# Patient Record
Sex: Male | Born: 1960 | Race: White | Hispanic: No | Marital: Single | Smoking: Current every day smoker
Health system: Southern US, Community
[De-identification: ages and names within clinical notes are randomized; demographics above are authoritative.]

---

## 2008-06-20 ENCOUNTER — Emergency Department (HOSPITAL_COMMUNITY): Admission: EM | Admit: 2008-06-20 | Discharge: 2008-06-20 | Payer: Self-pay | Admitting: Emergency Medicine

## 2009-08-24 ENCOUNTER — Emergency Department (HOSPITAL_COMMUNITY): Admission: EM | Admit: 2009-08-24 | Discharge: 2009-08-24 | Payer: Self-pay | Admitting: Emergency Medicine

## 2011-03-29 ENCOUNTER — Emergency Department (HOSPITAL_COMMUNITY)
Admission: EM | Admit: 2011-03-29 | Discharge: 2011-03-29 | Disposition: A | Discharge status: Home Health | Payer: Self-pay | Attending: Emergency Medicine | Admitting: Emergency Medicine

## 2011-03-29 ENCOUNTER — Encounter (HOSPITAL_COMMUNITY): Payer: Self-pay | Admitting: Emergency Medicine

## 2011-03-29 DIAGNOSIS — K029 Dental caries, unspecified: Secondary | ICD-10-CM | POA: Insufficient documentation

## 2011-03-29 DIAGNOSIS — K0889 Other specified disorders of teeth and supporting structures: Secondary | ICD-10-CM

## 2011-03-29 MED ORDER — PENICILLIN V POTASSIUM 500 MG PO TABS: ORAL_TABLET | ORAL | Status: DC

## 2011-03-29 MED ORDER — HYDROCODONE-ACETAMINOPHEN 5-325 MG PO TABS: 1.0000 | ORAL_TABLET | ORAL | Status: AC | PRN

## 2011-03-29 MED ORDER — HYDROCODONE-ACETAMINOPHEN 5-325 MG PO TABS
2.0000 | ORAL_TABLET | Freq: Once | ORAL | Status: AC
  Administered 2011-03-29: 2 via ORAL
  Filled 2011-03-29: qty 2

## 2011-03-29 MED ORDER — IBUPROFEN 800 MG PO TABS
800.0000 mg | ORAL_TABLET | Freq: Once | ORAL | Status: AC
  Administered 2011-03-29: 800 mg via ORAL
  Filled 2011-03-29: qty 1

## 2011-03-29 MED ORDER — PENICILLIN V POTASSIUM 250 MG PO TABS
500.0000 mg | ORAL_TABLET | Freq: Once | ORAL | Status: AC
  Administered 2011-03-29: 500 mg via ORAL
  Filled 2011-03-29: qty 2

## 2011-03-29 NOTE — Discharge Instructions (Signed)
Toothache Toothaches are usually caused by tooth decay (cavity). However, other causes of toothache include:  Gum disease.   Cracked tooth.   Cracked filling.   Injury.   Jaw problem (temporo mandibular joint or TMJ disorder).   Tooth abscess.   Root sensitivity.   Grinding.   Eruption problems.  Swelling and redness around a painful tooth often means you have a dental abscess. Pain medicine and antibiotics can help reduce symptoms, but you will need to see a dentist within the next few days to have your problem properly evaluated and treated. If tooth decay is the problem, you may need a filling or root canal to save your tooth. If the problem is more severe, your tooth may need to be pulled. SEEK IMMEDIATE MEDICAL CARE IF:  You cannot swallow.   You develop severe swelling, increased redness, or increased pain in your mouth or face.   You have a fever.   You cannot open your mouth adequately.  Document Released: 02/13/2004 Document Revised: 12/25/2010 Document Reviewed: 04/04/2009 [employer] Patient Information 2012 [employer]. 

## 2011-03-29 NOTE — ED Notes (Signed)
Pt with dental pain since Friday. Scheduled to have teeth pulled next week

## 2011-03-29 NOTE — ED Provider Notes (Signed)
History     CSN: [id number]  Arrival date & time 03/29/11  1350   First MD Initiated Contact with Patient 03/29/11 1534      Chief Complaint  Patient presents with  . Dental Pain    (Consider location/radiation/quality/duration/timing/severity/associated sxs/prior treatment) Patient is a 51 y.o. male presenting with tooth pain. The history is provided by the patient.  Dental PainThe primary symptoms include mouth pain and headaches. Primary symptoms do not include shortness of breath or cough. The symptoms began 2 days ago. The symptoms are worsening. The symptoms occur constantly.  The headache is not associated with photophobia.  Additional symptoms include: dental sensitivity to temperature, gum tenderness and jaw pain. Additional symptoms do not include: nosebleeds. Medical issues do not include: alcohol problem and smoking.    History reviewed. No pertinent past medical history.  History reviewed. No pertinent past surgical history.  No family history on file.  History  Substance Use Topics  . Smoking status: Never Smoker   . Smokeless tobacco: Not on file  . Alcohol Use: No      Review of Systems  Constitutional: Negative for activity change.       All ROS Neg except as noted in HPI  HENT: Positive for dental problem. Negative for nosebleeds and neck pain.   Eyes: Negative for photophobia and discharge.  Respiratory: Negative for cough, shortness of breath and wheezing.   Cardiovascular: Negative for chest pain and palpitations.  Gastrointestinal: Negative for abdominal pain and blood in stool.  Genitourinary: Negative for dysuria, frequency and hematuria.  Musculoskeletal: Negative for back pain and arthralgias.  Skin: Negative.   Neurological: Positive for headaches. Negative for dizziness, seizures and speech difficulty.  Psychiatric/Behavioral: Negative for hallucinations and confusion.    Allergies  Review of patient's allergies indicates no known  allergies.  Home Medications   Current Outpatient Rx  Name Route Sig Dispense Refill  . IBUPROFEN 200 MG PO TABS Oral Take 600 mg by mouth every 6 (six) hours as needed. For pain      BP 137/81  Pulse 71  Temp(Src) 97.6 F (36.4 C) (Oral)  Resp 18  Ht 5\' 9"  (1.753 m)  Wt 190 lb (86.183 kg)  BMI 28.06 kg/m2  SpO2 100%  Physical Exam  Nursing note and vitals reviewed. Constitutional: He is oriented to person, place, and time. He appears well-developed and well-nourished.  Non-toxic appearance.  HENT:  Head: Normocephalic.  Right Ear: Tympanic membrane and external ear normal.  Left Ear: Tympanic membrane and external ear normal.       Multiple dental caries, left greater than right. Airway patent.  Eyes: EOM and lids are normal. Pupils are equal, round, and reactive to light.  Neck: Normal range of motion. Neck supple. Carotid bruit is not present.  Cardiovascular: Normal rate, regular rhythm, normal heart sounds, intact distal pulses and normal pulses.   Pulmonary/Chest: Breath sounds normal. No respiratory distress.  Abdominal: Soft. Bowel sounds are normal. There is no tenderness. There is no guarding.  Musculoskeletal: Normal range of motion.  Lymphadenopathy:       Head (right side): No submandibular adenopathy present.       Head (left side): No submandibular adenopathy present.    He has no cervical adenopathy.  Neurological: He is alert and oriented to person, place, and time. He has normal strength. No cranial nerve deficit or sensory deficit.  Skin: Skin is warm and dry.  Psychiatric: He has a normal mood and affect. His  speech is normal.    ED Course  Procedures (including critical care time)  Labs Reviewed - No data to display No results found.   No diagnosis found.    MDM  I have reviewed nursing notes, vital signs, and all appropriate lab and imaging results for this patient. Pt to see a dentist in 2 weeks. Rx for amoxicillin and norco  given.       Kathie Dike, [place] 03/29/11 1540

## 2011-03-29 NOTE — ED Notes (Signed)
Pt DC to home with steady gait 

## 2011-03-30 NOTE — ED Provider Notes (Signed)
Medical screening examination/treatment/procedure(s) were performed by non-physician practitioner and as supervising physician I was immediately available for consultation/collaboration.   Dione Booze, MD 03/30/11 [phone]

## 2011-04-19 ENCOUNTER — Emergency Department (HOSPITAL_COMMUNITY)
Admission: EM | Admit: 2011-04-19 | Discharge: 2011-04-19 | Disposition: A | Discharge status: Home / Self Care | Payer: Self-pay | Attending: Emergency Medicine | Admitting: Emergency Medicine

## 2011-04-19 ENCOUNTER — Encounter (HOSPITAL_COMMUNITY): Payer: Self-pay | Admitting: *Deleted

## 2011-04-19 ENCOUNTER — Emergency Department (HOSPITAL_COMMUNITY): Payer: Self-pay

## 2011-04-19 DIAGNOSIS — W230XXA Caught, crushed, jammed, or pinched between moving objects, initial encounter: Secondary | ICD-10-CM | POA: Insufficient documentation

## 2011-04-19 DIAGNOSIS — F17200 Nicotine dependence, unspecified, uncomplicated: Secondary | ICD-10-CM | POA: Insufficient documentation

## 2011-04-19 DIAGNOSIS — S6000XA Contusion of unspecified finger without damage to nail, initial encounter: Secondary | ICD-10-CM | POA: Insufficient documentation

## 2011-04-19 MED ORDER — HYDROCODONE-ACETAMINOPHEN 5-325 MG PO TABS: 1.0000 | ORAL_TABLET | ORAL | Status: AC | PRN

## 2011-04-19 MED ORDER — HYDROCODONE-ACETAMINOPHEN 5-325 MG PO TABS
1.0000 | ORAL_TABLET | Freq: Once | ORAL | Status: AC
  Administered 2011-04-19: 1 via ORAL
  Filled 2011-04-19: qty 1

## 2011-04-19 NOTE — ED Notes (Signed)
Injury to left hand yesterday.  Reports hand was stuck between tank and bed of truck.  C/o pain.

## 2011-04-19 NOTE — ED Provider Notes (Signed)
History     CSN: [id number]  Arrival date & time 04/19/11  1115   First MD Initiated Contact with Patient 04/19/11 1136      Chief Complaint  Patient presents with  . Hand Pain    (Consider location/radiation/quality/duration/timing/severity/associated sxs/prior treatment) HPI Comments: Patient sustained an injury to his left thumb yesterday at home as it was briefly caught between a gas pain and the bed of his truck  Patient is a 51 y.o. male presenting with hand pain. The history is provided by the patient.  Hand Pain This is a new problem. The current episode started yesterday. The problem occurs constantly. The problem has been unchanged. Associated symptoms include arthralgias and joint swelling. Pertinent negatives include no abdominal pain, chest pain, congestion, fever, headaches, nausea, neck pain, numbness, rash, sore throat or weakness. Exacerbated by: Palpation and attempts at range of motion increased pain. He has tried NSAIDs for the symptoms. The treatment provided mild relief.    History reviewed. No pertinent past medical history.  History reviewed. No pertinent past surgical history.  No family history on file.  History  Substance Use Topics  . Smoking status: Current Everyday Smoker    Types: Cigarettes  . Smokeless tobacco: Not on file  . Alcohol Use: No      Review of Systems  Constitutional: Negative for fever.  HENT: Negative for congestion, sore throat and neck pain.   Eyes: Negative.   Respiratory: Negative for chest tightness and shortness of breath.   Cardiovascular: Negative for chest pain.  Gastrointestinal: Negative for nausea and abdominal pain.  Genitourinary: Negative.   Musculoskeletal: Positive for joint swelling and arthralgias.  Skin: Negative.  Negative for rash and wound.  Neurological: Negative for dizziness, weakness, light-headedness, numbness and headaches.  Hematological: Negative.   Psychiatric/Behavioral: Negative.      Allergies  Review of patient's allergies indicates no known allergies.  Home Medications   Current Outpatient Rx  Name Route Sig Dispense Refill  . HYDROCODONE-ACETAMINOPHEN 5-325 MG PO TABS Oral Take 1 tablet by mouth every 4 (four) hours as needed for pain. 20 tablet 0  . IBUPROFEN 200 MG PO TABS Oral Take 600 mg by mouth every 6 (six) hours as needed. For pain    . PENICILLIN V POTASSIUM 500 MG PO TABS  2 po bid with food 28 tablet 0    BP 107/70  Pulse 73  Temp(Src) 97.7 F (36.5 C) (Oral)  Resp 16  Ht 5\' 9"  (1.753 m)  Wt 165 lb (74.844 kg)  BMI 24.37 kg/m2  SpO2 97%  Physical Exam  Nursing note and vitals reviewed. Constitutional: He is oriented to person, place, and time. He appears well-developed and well-nourished.  HENT:  Head: Normocephalic and atraumatic.  Cardiovascular: Normal rate and intact distal pulses.  Exam reveals no decreased pulses.   Pulses:      Dorsalis pedis pulses are 2+ on the right side, and 2+ on the left side.       Posterior tibial pulses are 2+ on the right side, and 2+ on the left side.  Pulmonary/Chest: Effort normal.  Musculoskeletal: Normal range of motion. He exhibits edema and tenderness.       Left hand: He exhibits tenderness and swelling. He exhibits normal capillary refill and no deformity. normal sensation noted.       Hands:      Distal sensation of left thumb is intact.  Patient has range of motion but it is uncomfortable.  Strength of  left thumb not assessed secondary to pain.  Neurological: He is alert and oriented to person, place, and time. No sensory deficit.  Skin: Skin is warm, dry and intact.  Psychiatric: He has a normal mood and affect.    ED Course  Procedures (including critical care time)  Labs Reviewed - No data to display Dg Finger Thumb Left  04/19/2011  *RADIOLOGY REPORT*  Clinical Data: Left thumb pain.  LEFT THUMB 2+V  Comparison: No priors.  Findings: Three views of the left thumb demonstrates no  acute fracture, subluxation, dislocation, joint or soft tissue abnormality.  A tiny amount of high-density material is noted in the superficial soft tissues along the volar aspect of the tip of the thumb, apparently on the skin surface.  IMPRESSION: 1.  No acute radiographic abnormality of the left thumb.  Original Report Authenticated By: Florencia Reasons, M.D.     1. Finger contusion       MDM  X-ray of left thumb is negative today for bony injury.  Patient was given finger splint and encouraged to continue using ice and elevation for the next day for comfort.  Also recommended continuing his home Advil 4 tablets q. 8 hours for the next 5 days.  Hydrocodone prescribed.  Follow up with PCP in one week if not improving.        Candis Musa, PA 04/19/11 1230

## 2011-04-19 NOTE — Discharge Instructions (Signed)
Contusion A contusion is a deep bruise. Contusions are the result of an injury that caused bleeding under the skin. The contusion may turn blue, purple, or yellow. Minor injuries will give you a painless contusion, but more severe contusions may stay painful and swollen for a few weeks.  CAUSES  A contusion is usually caused by a blow, trauma, or direct force to an area of the body. SYMPTOMS   Swelling and redness of the injured area.   Bruising of the injured area.   Tenderness and soreness of the injured area.   Pain.  DIAGNOSIS  The diagnosis can be made by taking a history and physical exam. An X-ray, CT scan, or MRI may be needed to determine if there were any associated injuries, such as fractures. TREATMENT  Specific treatment will depend on what area of the body was injured. In general, the best treatment for a contusion is resting, icing, elevating, and applying cold compresses to the injured area. Over-the-counter medicines may also be recommended for pain control. Ask your caregiver what the best treatment is for your contusion. HOME CARE INSTRUCTIONS   Put ice on the injured area.   Put ice in a plastic bag.   Place a towel between your skin and the bag.   Leave the ice on for 15 to 20 minutes, 3 to 4 times a day.   Only take over-the-counter or prescription medicines for pain, discomfort, or fever as directed by your caregiver. Your caregiver may recommend avoiding anti-inflammatory medicines (aspirin, ibuprofen, and naproxen) for 48 hours because these medicines may increase bruising.   Rest the injured area.   If possible, elevate the injured area to reduce swelling.  SEEK IMMEDIATE MEDICAL CARE IF:   You have increased bruising or swelling.   You have pain that is getting worse.   Your swelling or pain is not relieved with medicines.  MAKE SURE YOU:   Understand these instructions.   Will watch your condition.   Will get help right away if you are not  doing well or get worse.  Document Released: 10/15/2004 Document Revised: 12/25/2010 Document Reviewed: 11/10/2010 [facility] Patient Information 2012 [place], [place].   Your x-rays normal today.  Continue to use ice and elevation for the next couple of days as discussed for pain relief.  Also used a finger splint as needed for pain.  Do not drive within 4 as of taking hydrocodone as this medication will make you drowsy.  I do encourage he continue using Advil taking 4 tablets every 8 hours with food for the next 5 days for swelling and healing.  Call your doctor for recheck if your symptoms are not getting better over the next week.

## 2011-04-19 NOTE — ED Notes (Signed)
Pt seen by PA prior to my assessment. Swelling and bruising noted to end of left thumb.

## 2011-04-20 NOTE — ED Provider Notes (Signed)
Medical screening examination/treatment/procedure(s) were performed by non-physician practitioner and as supervising physician I was immediately available for consultation/collaboration.  Shelda Jakes, MD 04/20/11 [phone]

## 2011-06-03 ENCOUNTER — Emergency Department (HOSPITAL_COMMUNITY)
Admission: EM | Admit: 2011-06-03 | Discharge: 2011-06-03 | Disposition: A | Discharge status: Home / Self Care | Payer: Self-pay | Attending: Emergency Medicine | Admitting: Emergency Medicine

## 2011-06-03 ENCOUNTER — Encounter (HOSPITAL_COMMUNITY): Payer: Self-pay | Admitting: *Deleted

## 2011-06-03 ENCOUNTER — Emergency Department (HOSPITAL_COMMUNITY): Payer: Self-pay

## 2011-06-03 DIAGNOSIS — S8012XA Contusion of left lower leg, initial encounter: Secondary | ICD-10-CM

## 2011-06-03 DIAGNOSIS — IMO0002 Reserved for concepts with insufficient information to code with codable children: Secondary | ICD-10-CM | POA: Insufficient documentation

## 2011-06-03 DIAGNOSIS — Y92009 Unspecified place in unspecified non-institutional (private) residence as the place of occurrence of the external cause: Secondary | ICD-10-CM | POA: Insufficient documentation

## 2011-06-03 DIAGNOSIS — S8010XA Contusion of unspecified lower leg, initial encounter: Secondary | ICD-10-CM | POA: Insufficient documentation

## 2011-06-03 DIAGNOSIS — W11XXXA Fall on and from ladder, initial encounter: Secondary | ICD-10-CM | POA: Insufficient documentation

## 2011-06-03 DIAGNOSIS — S80812A Abrasion, left lower leg, initial encounter: Secondary | ICD-10-CM

## 2011-06-03 DIAGNOSIS — M79609 Pain in unspecified limb: Secondary | ICD-10-CM | POA: Insufficient documentation

## 2011-06-03 DIAGNOSIS — F17200 Nicotine dependence, unspecified, uncomplicated: Secondary | ICD-10-CM | POA: Insufficient documentation

## 2011-06-03 MED ORDER — HYDROCODONE-ACETAMINOPHEN 5-325 MG PO TABS
1.0000 | ORAL_TABLET | Freq: Once | ORAL | Status: AC
  Administered 2011-06-03: 1 via ORAL
  Filled 2011-06-03: qty 1

## 2011-06-03 MED ORDER — HYDROCODONE-ACETAMINOPHEN 5-325 MG PO TABS: 1.0000 | ORAL_TABLET | Freq: Four times a day (QID) | ORAL | Status: AC | PRN

## 2011-06-03 NOTE — ED Notes (Signed)
Pt up to bathroom with crutches.

## 2011-06-03 NOTE — ED Notes (Signed)
Pt reports painting house and fell off of extension ladder.  Pt denies complete fall, states he caught himself and hit left leg on ladder.  Small abrasion and swelling noted on pt's left shin. No active bleeding.  Denies pain in any other location.

## 2011-06-03 NOTE — Discharge Instructions (Signed)
Contusion  A contusion is a deep bruise. Contusions are the result of an injury that caused bleeding under the skin. The contusion may turn blue, purple, or yellow. Minor injuries will give you a painless contusion, but more severe contusions may stay painful and swollen for a few weeks.   CAUSES   A contusion is usually caused by a blow, trauma, or direct force to an area of the body.  SYMPTOMS    Swelling and redness of the injured area.   Bruising of the injured area.   Tenderness and soreness of the injured area.   Pain.  DIAGNOSIS   The diagnosis can be made by taking a history and physical exam. An X-ray, CT scan, or MRI may be needed to determine if there were any associated injuries, such as fractures.  TREATMENT   Specific treatment will depend on what area of the body was injured. In general, the best treatment for a contusion is resting, icing, elevating, and applying cold compresses to the injured area. Over-the-counter medicines may also be recommended for pain control. Ask your caregiver what the best treatment is for your contusion.  HOME CARE INSTRUCTIONS    Put ice on the injured area.   Put ice in a plastic bag.   Place a towel between your skin and the bag.   Leave the ice on for 15 to 20 minutes, 3 to 4 times a day.   Only take over-the-counter or prescription medicines for pain, discomfort, or fever as directed by your caregiver. Your caregiver may recommend avoiding anti-inflammatory medicines (aspirin, ibuprofen, and naproxen) for 48 hours because these medicines may increase bruising.   Rest the injured area.   If possible, elevate the injured area to reduce swelling.  SEEK IMMEDIATE MEDICAL CARE IF:    You have increased bruising or swelling.   You have pain that is getting worse.   Your swelling or pain is not relieved with medicines.  MAKE SURE YOU:    Understand these instructions.   Will watch your condition.   Will get help right away if you are not doing well or get  worse.  Document Released: 10/15/2004 Document Revised: 12/25/2010 Document Reviewed: 11/10/2010  [employer] Patient Information 2012 [employer].  Abrasions  Abrasions are skin scrapes. Their treatment depends on how large and deep the abrasion is. Abrasions do not extend through all layers of the skin. A cut or lesion through all skin layers is called a laceration.  HOME CARE INSTRUCTIONS    If you were given a dressing, change it at least once a day or as instructed by your caregiver. If the bandage sticks, soak it off with a solution of water or hydrogen peroxide.   Twice a day, wash the area with soap and water to remove all the cream/ointment. You may do this in a sink, under a tub faucet, or in a shower. Rinse off the soap and pat dry with a clean towel. Look for signs of infection (see below).   Reapply cream/ointment according to your caregiver's instruction. This will help prevent infection and keep the bandage from sticking. Telfa or gauze over the wound and under the dressing or wrap will also help keep the bandage from sticking.   If the bandage becomes wet, dirty, or develops a foul smell, change it as soon as possible.   Only take over-the-counter or prescription medicines for pain, discomfort, or fever as directed by your caregiver.  SEEK IMMEDIATE MEDICAL CARE IF:      to touch, or pus coming from the wound.   You have a fever.   Any foul smell coming from the wound or dressing.  Most skin wounds heal within ten days. Facial wounds heal faster. However, an infection may occur despite proper treatment. You should have the wound checked for signs of infection within 24 to 48 hours or sooner if problems arise. If you were not given a wound-check appointment, look closely at the wound yourself on the second day for early signs of infection listed above. MAKE SURE  YOU:   Understand these instructions.   Will watch your condition.   Will get help right away if you are not doing well or get worse.  Document Released: 10/15/2004 Document Revised: 12/25/2010 Document Reviewed: 12/09/2010 [facility] Patient Information 2012 [place], [place].Cryotherapy Cryotherapy means treatment with cold. Ice or gel packs can be used to reduce both pain and swelling. Ice is the most helpful within the first 24 to 48 hours after an injury or flareup from overusing a muscle or joint. Sprains, strains, spasms, burning pain, shooting pain, and aches can all be eased with ice. Ice can also be used when recovering from surgery. Ice is effective, has very few side effects, and is safe for most people to use. PRECAUTIONS  Ice is not a safe treatment option for people with:  Raynaud's phenomenon. This is a condition affecting small blood vessels in the extremities. Exposure to cold may cause your problems to return.   Cold hypersensitivity. There are many forms of cold hypersensitivity, including:   Cold urticaria. Red, itchy hives appear on the skin when the tissues begin to warm after being iced.   Cold erythema. This is a red, itchy rash caused by exposure to cold.   Cold hemoglobinuria. Red blood cells break down when the tissues begin to warm after being iced. The hemoglobin that carry oxygen are passed into the urine because they cannot combine with blood proteins fast enough.   Numbness or altered sensitivity in the area being iced.  If you have any of the following conditions, do not use ice until you have discussed cryotherapy with your caregiver:  Heart conditions, such as arrhythmia, angina, or chronic heart disease.   High blood pressure.   Healing wounds or open skin in the area being iced.   Current infections.   Rheumatoid arthritis.   Poor circulation.   Diabetes.  Ice slows the blood flow in the region it is applied. This is beneficial when trying to  stop inflamed tissues from spreading irritating chemicals to surrounding tissues. However, if you expose your skin to cold temperatures for too long or without the proper protection, you can damage your skin or nerves. Watch for signs of skin damage due to cold. HOME CARE INSTRUCTIONS Follow these tips to use ice and cold packs safely.  Place a dry or damp towel between the ice and skin. A damp towel will cool the skin more quickly, so you may need to shorten the time that the ice is used.   For a more rapid response, add gentle compression to the ice.   Ice for no more than 10 to 20 minutes at a time. The bonier the area you are icing, the less time it will take to get the benefits of ice.   Check your skin after 5 minutes to make sure there are no signs of a poor response to cold or skin damage.   Rest 20 minutes or more in between  uses.   Once your skin is numb, you can end your treatment. You can test numbness by very lightly touching your skin. The touch should be so light that you do not see the skin dimple from the pressure of your fingertip. When using ice, most people will feel these normal sensations in this order: cold, burning, aching, and numbness.   Do not use ice on someone who cannot communicate their responses to pain, such as small children or people with dementia.  HOW TO MAKE AN ICE PACK Ice packs are the most common way to use ice therapy. Other methods include ice massage, ice baths, and cryo-sprays. Muscle creams that cause a cold, tingly feeling do not offer the same benefits that ice offers and should not be used as a substitute unless recommended by your caregiver. To make an ice pack, do one of the following:  Place crushed ice or a bag of frozen vegetables in a sealable plastic bag. Squeeze out the excess air. Place this bag inside another plastic bag. Slide the bag into a pillowcase or place a damp towel between your skin and the bag.   Mix 3 parts water with 1  part rubbing alcohol. Freeze the mixture in a sealable plastic bag. When you remove the mixture from the freezer, it will be slushy. Squeeze out the excess air. Place this bag inside another plastic bag. Slide the bag into a pillowcase or place a damp towel between your skin and the bag.  SEEK MEDICAL CARE IF:  You develop white spots on your skin. This may give the skin a blotchy (mottled) appearance.   Your skin turns blue or pale.   Your skin becomes waxy or hard.   Your swelling gets worse.  MAKE SURE YOU:   Understand these instructions.   Will watch your condition.   Will get help right away if you are not doing well or get worse.  Document Released: 09/01/2010 Document Revised: 12/25/2010 Document Reviewed: 09/01/2010 [facility] Patient Information 2012 [place], [place].   Take the pain medicine and ibuprofen as needed.  Apply ice several times daily and elevate as much as possible.  Use the crutches as needed and bear weight.  Follow up with dr. Hilda Lias as needed.

## 2011-06-03 NOTE — ED Provider Notes (Signed)
History     CSN: [id number]  Arrival date & time 06/03/11  1907   First MD Initiated Contact with Patient 06/03/11 2012      No chief complaint on file.   (Consider location/radiation/quality/duration/timing/severity/associated sxs/prior treatment) HPI Comments: Standing on an extension ladder.  Started to descend and missed a step.  Slid and struck L shin on rung of ladder.  No other injuries.  The history is provided by the patient. No language interpreter was used.    History reviewed. No pertinent past medical history.  History reviewed. No pertinent past surgical history.  History reviewed. No pertinent family history.  History  Substance Use Topics  . Smoking status: Current Everyday Smoker -- 1.0 packs/day    Types: Cigarettes  . Smokeless tobacco: Not on file  . Alcohol Use: No      Review of Systems  Musculoskeletal:       Lower leg injury  Neurological: Negative for weakness and numbness.  All other systems reviewed and are negative.    Allergies  Review of patient's allergies indicates no known allergies.  Home Medications   Current Outpatient Rx  Name Route Sig Dispense Refill  . IBUPROFEN 200 MG PO TABS Oral Take 600 mg by mouth every 6 (six) hours as needed. For pain    . PENICILLIN V POTASSIUM 500 MG PO TABS  2 po bid with food 28 tablet 0    BP 130/68  Pulse 66  Temp(Src) 97.8 F (36.6 C) (Oral)  Resp 16  Ht 5\' 11"  (1.803 m)  Wt 155 lb (70.308 kg)  BMI 21.62 kg/m2  SpO2 98%  Physical Exam  Nursing note and vitals reviewed. Constitutional: He is oriented to person, place, and time. He appears well-developed and well-nourished.  HENT:  Head: Normocephalic and atraumatic.  Eyes: EOM are normal.  Neck: Normal range of motion.  Cardiovascular: Normal rate, regular rhythm, normal heart sounds and intact distal pulses.   Pulmonary/Chest: Effort normal and breath sounds normal. No respiratory distress.  Abdominal: Soft. He exhibits no  distension. There is no tenderness.  Musculoskeletal: Normal range of motion. He exhibits tenderness.       Abrasion, mild swelling and PT to L mid tibial area.  Pain with weight bearing.  Neurological: He is alert and oriented to person, place, and time.  Skin: Skin is warm and dry.  Psychiatric: He has a normal mood and affect. Judgment normal.    ED Course  Procedures (including critical care time)  Labs Reviewed - No data to display Dg Tibia/fibula Left  06/03/2011  *RADIOLOGY REPORT*  Clinical Data: Fall with leg pain.  LEFT TIBIA AND FIBULA - 2 VIEW  Comparison: None.  Findings: No acute fracture.  IMPRESSION: No acute fracture.  Original Report Authenticated By: Reyes Ivan, M.D.     1. Contusion of left lower leg   2. Abrasion of left lower leg       MDM  Ice, ibuprofen, crutches rx-hydrocodone, 10 F/u with dr. Hilda Lias PRN        Worthy Rancher, PA 06/03/11 2119

## 2011-06-04 NOTE — ED Provider Notes (Signed)
Medical screening examination/treatment/procedure(s) were performed by non-physician practitioner and as supervising physician I was immediately available for consultation/collaboration.   Dione Booze, MD 06/04/11 [phone]

## 2011-09-05 ENCOUNTER — Emergency Department (HOSPITAL_COMMUNITY)
Admission: EM | Admit: 2011-09-05 | Discharge: 2011-09-05 | Disposition: A | Discharge status: Home / Self Care | Payer: Self-pay | Attending: Emergency Medicine | Admitting: Emergency Medicine

## 2011-09-05 ENCOUNTER — Encounter (HOSPITAL_COMMUNITY): Payer: Self-pay | Admitting: *Deleted

## 2011-09-05 DIAGNOSIS — K029 Dental caries, unspecified: Secondary | ICD-10-CM | POA: Insufficient documentation

## 2011-09-05 DIAGNOSIS — K047 Periapical abscess without sinus: Secondary | ICD-10-CM | POA: Insufficient documentation

## 2011-09-05 DIAGNOSIS — F17200 Nicotine dependence, unspecified, uncomplicated: Secondary | ICD-10-CM | POA: Insufficient documentation

## 2011-09-05 DIAGNOSIS — K0889 Other specified disorders of teeth and supporting structures: Secondary | ICD-10-CM

## 2011-09-05 MED ORDER — HYDROCODONE-ACETAMINOPHEN 5-325 MG PO TABS: 1.0000 | ORAL_TABLET | Freq: Four times a day (QID) | ORAL | Status: AC | PRN

## 2011-09-05 MED ORDER — PENICILLIN V POTASSIUM 500 MG PO TABS: 500.0000 mg | ORAL_TABLET | Freq: Four times a day (QID) | ORAL | Status: AC

## 2011-09-05 MED ORDER — PENICILLIN V POTASSIUM 250 MG PO TABS
500.0000 mg | ORAL_TABLET | Freq: Once | ORAL | Status: AC
  Administered 2011-09-05: 500 mg via ORAL
  Filled 2011-09-05: qty 2

## 2011-09-05 MED ORDER — HYDROCODONE-ACETAMINOPHEN 5-325 MG PO TABS
1.0000 | ORAL_TABLET | Freq: Once | ORAL | Status: AC
  Administered 2011-09-05: 1 via ORAL
  Filled 2011-09-05: qty 1

## 2011-09-05 MED ORDER — IBUPROFEN 800 MG PO TABS
800.0000 mg | ORAL_TABLET | Freq: Once | ORAL | Status: AC
  Administered 2011-09-05: 800 mg via ORAL
  Filled 2011-09-05: qty 1

## 2011-09-05 NOTE — ED Provider Notes (Signed)
History     CSN: [id number]  Arrival date & time 09/05/11  1948   First MD Initiated Contact with Patient 09/05/11 2003      Chief Complaint  Patient presents with  . Dental Pain    (Consider location/radiation/quality/duration/timing/severity/associated sxs/prior treatment) HPI Comments: States he has an appt for extraction with dentist in [place] in 6 days.  No fever or chills.  Patient is a 51 y.o. male presenting with tooth pain. The history is provided by the patient. No language interpreter was used.  Dental PainThe primary symptoms include mouth pain. Primary symptoms do not include dental injury or fever. Episode onset: several days ago. The symptoms are unchanged. The symptoms occur constantly.  Additional symptoms include: gum swelling. Additional symptoms do not include: trismus and facial swelling.    History reviewed. No pertinent past medical history.  History reviewed. No pertinent past surgical history.  History reviewed. No pertinent family history.  History  Substance Use Topics  . Smoking status: Current Everyday Smoker -- 1.0 packs/day    Types: Cigarettes  . Smokeless tobacco: Not on file  . Alcohol Use: No      Review of Systems  Constitutional: Negative for fever and chills.  HENT: Positive for dental problem. Negative for facial swelling.   All other systems reviewed and are negative.    Allergies  Review of patient's allergies indicates no known allergies.  Home Medications   Current Outpatient Rx  Name Route Sig Dispense Refill  . HYDROCODONE-ACETAMINOPHEN 5-325 MG PO TABS Oral Take 1 tablet by mouth every 6 (six) hours as needed for pain. 20 tablet 0  . PENICILLIN V POTASSIUM 500 MG PO TABS Oral Take 1 tablet (500 mg total) by mouth 4 (four) times daily. 40 tablet 0    There were no vitals taken for this visit.  Physical Exam  Nursing note and vitals reviewed. Constitutional: He is oriented to person, place, and time. He appears  well-developed and well-nourished.  HENT:  Head: Normocephalic and atraumatic.  Mouth/Throat: Uvula is midline and mucous membranes are normal. Abnormal dentition. Dental abscesses and dental caries present. No uvula swelling. No oropharyngeal exudate, posterior oropharyngeal edema, posterior oropharyngeal erythema or tonsillar abscesses.    Eyes: EOM are normal.  Neck: Normal range of motion.  Cardiovascular: Normal rate, regular rhythm, normal heart sounds and intact distal pulses.   Pulmonary/Chest: Effort normal and breath sounds normal. No respiratory distress.  Abdominal: Soft. He exhibits no distension. There is no tenderness.  Musculoskeletal: Normal range of motion.  Neurological: He is alert and oriented to person, place, and time.  Skin: Skin is warm and dry.  Psychiatric: He has a normal mood and affect. Judgment normal.    ED Course  Procedures (including critical care time)  Labs Reviewed - No data to display No results found.   1. Pain, dental       MDM  rx-pen VK 500 mg, 40 rx-hydrocodone, 20 OTC ibuprofen 800 mg TID F/u with dentist as planned.        Evalina Field, [place] 09/05/11 2029

## 2011-09-05 NOTE — ED Provider Notes (Signed)
Medical screening examination/treatment/procedure(s) were performed by non-physician practitioner and as supervising physician I was immediately available for consultation/collaboration.  Randon Somera, MD 09/05/11 2107 

## 2011-09-05 NOTE — ED Notes (Signed)
Pt c/o pain to upper front tooth/gum. Can't see dentist until Friday.

## 2011-12-20 ENCOUNTER — Emergency Department (HOSPITAL_COMMUNITY)
Admission: EM | Admit: 2011-12-20 | Discharge: 2011-12-20 | Disposition: A | Discharge status: Home / Self Care | Payer: Self-pay | Attending: Emergency Medicine | Admitting: Emergency Medicine

## 2011-12-20 ENCOUNTER — Encounter (HOSPITAL_COMMUNITY): Payer: Self-pay | Admitting: Emergency Medicine

## 2011-12-20 DIAGNOSIS — F17200 Nicotine dependence, unspecified, uncomplicated: Secondary | ICD-10-CM | POA: Insufficient documentation

## 2011-12-20 DIAGNOSIS — K047 Periapical abscess without sinus: Secondary | ICD-10-CM | POA: Insufficient documentation

## 2011-12-20 DIAGNOSIS — Z79899 Other long term (current) drug therapy: Secondary | ICD-10-CM | POA: Insufficient documentation

## 2011-12-20 MED ORDER — PENICILLIN V POTASSIUM 500 MG PO TABS: 500.0000 mg | ORAL_TABLET | Freq: Three times a day (TID) | ORAL | Status: DC

## 2011-12-20 MED ORDER — HYDROCODONE-ACETAMINOPHEN 5-325 MG PO TABS
1.0000 | ORAL_TABLET | Freq: Once | ORAL | Status: AC
  Administered 2011-12-20: 1 via ORAL
  Filled 2011-12-20: qty 1

## 2011-12-20 MED ORDER — HYDROCODONE-ACETAMINOPHEN 5-325 MG PO TABS: 1.0000 | ORAL_TABLET | Freq: Four times a day (QID) | ORAL | Status: DC | PRN

## 2011-12-20 MED ORDER — PENICILLIN V POTASSIUM 250 MG PO TABS
500.0000 mg | ORAL_TABLET | Freq: Once | ORAL | Status: AC
  Administered 2011-12-20: 500 mg via ORAL
  Filled 2011-12-20: qty 2

## 2011-12-20 NOTE — ED Provider Notes (Signed)
Medical screening examination/treatment/procedure(s) were performed by non-physician practitioner and as supervising physician I was immediately available for consultation/collaboration.   Glynn Octave, MD 12/20/11 2351

## 2011-12-20 NOTE — ED Provider Notes (Signed)
History     CSN: [id number]  Arrival date & time 12/20/11  1754   First MD Initiated Contact with Patient 12/20/11 1845      Chief Complaint  Patient presents with  . Dental Pain    (Consider location/radiation/quality/duration/timing/severity/associated sxs/prior treatment) HPI Comments: Alan Stuart  presents with a 3 day history of dental pain and gingival swelling.   The patient has a history of generalized dental decay and is scheduled to have dental extractions on 12/11 by Dr Alan Stuart in [place].  He has developed pain and swelling on his left lower jawline.  There has had subjective  fevers, no chills, nausea or vomiting, also no complaint of difficulty swallowing,  Although chewing makes pain worse.  The patient has tried advil without relief of symptoms.      The history is provided by the patient.    History reviewed. No pertinent past medical history.  History reviewed. No pertinent past surgical history.  No family history on file.  History  Substance Use Topics  . Smoking status: Current Every Day Smoker -- 1.0 packs/day    Types: Cigarettes  . Smokeless tobacco: Not on file  . Alcohol Use: No      Review of Systems  Constitutional: Positive for fever.  HENT: Positive for dental problem. Negative for sore throat, facial swelling, neck pain and neck stiffness.   Respiratory: Negative for shortness of breath.     Allergies  Review of patient's allergies indicates no known allergies.  Home Medications   Current Outpatient Rx  Name  Route  Sig  Dispense  Refill  . HYDROCODONE-ACETAMINOPHEN 5-325 MG PO TABS   Oral   Take 1 tablet by mouth every 6 (six) hours as needed for pain.   20 tablet   0   . PENICILLIN V POTASSIUM 500 MG PO TABS   Oral   Take 1 tablet (500 mg total) by mouth 3 (three) times daily.   30 tablet   0     BP 121/82  Pulse 95  Temp 97.8 F (36.6 C) (Oral)  Resp 18  Ht 5\' 11"  (1.803 m)  Wt 175 lb (79.379 kg)  BMI 24.41  kg/m2  SpO2 98%  Physical Exam  Constitutional: He is oriented to person, place, and time. He appears well-developed and well-nourished. No distress.  HENT:  Head: Normocephalic and atraumatic.  Right Ear: Tympanic membrane and external ear normal.  Left Ear: Tympanic membrane and external ear normal.  Mouth/Throat: Oropharynx is clear and moist and mucous membranes are normal. No oral lesions. Dental abscesses present.         Severe dental decay left lower premolars,  Gingival edema without fluctuance or drainage.  Eyes: Conjunctivae normal are normal.  Neck: Normal range of motion. Neck supple.  Cardiovascular: Normal rate and normal heart sounds.   Pulmonary/Chest: Effort normal.  Abdominal: He exhibits no distension.  Musculoskeletal: Normal range of motion.  Lymphadenopathy:    He has no cervical adenopathy.  Neurological: He is alert and oriented to person, place, and time.  Skin: Skin is warm and dry. No erythema.  Psychiatric: He has a normal mood and affect.    ED Course  Procedures (including critical care time)  Labs Reviewed - No data to display No results found.   1. Dental abscess       MDM  Patient prescribed penicillin and Vicodin with first dose was given prior to discharge home.  He was encouraged to keep his  appointment with the oral surgeon in 10 days, calling sooner for any worsened symptoms.        Burgess Amor, [place] 12/20/11 1906

## 2011-12-20 NOTE — ED Notes (Signed)
Pt c/o dental pain on left side.

## 2012-05-28 ENCOUNTER — Encounter (HOSPITAL_COMMUNITY): Payer: Self-pay | Admitting: *Deleted

## 2012-05-28 ENCOUNTER — Emergency Department (HOSPITAL_COMMUNITY)
Admission: EM | Admit: 2012-05-28 | Discharge: 2012-05-28 | Disposition: A | Discharge status: Home / Self Care | Payer: Self-pay | Attending: Emergency Medicine | Admitting: Emergency Medicine

## 2012-05-28 DIAGNOSIS — K029 Dental caries, unspecified: Secondary | ICD-10-CM | POA: Insufficient documentation

## 2012-05-28 DIAGNOSIS — K08109 Complete loss of teeth, unspecified cause, unspecified class: Secondary | ICD-10-CM | POA: Insufficient documentation

## 2012-05-28 DIAGNOSIS — F17200 Nicotine dependence, unspecified, uncomplicated: Secondary | ICD-10-CM | POA: Insufficient documentation

## 2012-05-28 DIAGNOSIS — K047 Periapical abscess without sinus: Secondary | ICD-10-CM | POA: Insufficient documentation

## 2012-05-28 DIAGNOSIS — R220 Localized swelling, mass and lump, head: Secondary | ICD-10-CM | POA: Insufficient documentation

## 2012-05-28 MED ORDER — CLINDAMYCIN HCL 150 MG PO CAPS
300.0000 mg | ORAL_CAPSULE | Freq: Once | ORAL | Status: AC
  Administered 2012-05-28: 300 mg via ORAL
  Filled 2012-05-28: qty 2

## 2012-05-28 MED ORDER — HYDROCODONE-ACETAMINOPHEN 5-325 MG PO TABS
2.0000 | ORAL_TABLET | Freq: Once | ORAL | Status: AC
  Administered 2012-05-28: 2 via ORAL
  Filled 2012-05-28: qty 2

## 2012-05-28 MED ORDER — CLINDAMYCIN HCL 150 MG PO CAPS: 300.0000 mg | ORAL_CAPSULE | Freq: Four times a day (QID) | ORAL | Status: DC

## 2012-05-28 MED ORDER — HYDROCODONE-ACETAMINOPHEN 5-325 MG PO TABS: 1.0000 | ORAL_TABLET | ORAL | Status: DC | PRN

## 2012-05-28 NOTE — ED Provider Notes (Signed)
History     CSN: [id number]  Arrival date & time 05/28/12  1728   First MD Initiated Contact with Patient 05/28/12 1751      Chief Complaint  Patient presents with  . Dental Pain    (Consider location/radiation/quality/duration/timing/severity/associated sxs/prior treatment) HPI Comments: Alan Stuart is a 52 y.o. Male with a 2 day history of dental pain and gingival swelling.   The patient has a history of decay and progressive fractures to his lower anterior teeth and has recently started to cause increased pain and swelling.  There has been no fevers,  Chills, nausea or vomiting, also no complaint of difficulty swallowing,  Although chewing makes pain worse.  The patient has tried Tylenol without relief of symptoms.      The history is provided by the patient.    History reviewed. No pertinent past medical history.  History reviewed. No pertinent past surgical history.  No family history on file.  History  Substance Use Topics  . Smoking status: Current Every Day Smoker -- 1.00 packs/day    Types: Cigarettes  . Smokeless tobacco: Not on file  . Alcohol Use: No      Review of Systems  Constitutional: Negative for fever.  HENT: Positive for facial swelling and dental problem. Negative for sore throat, neck pain and neck stiffness.   Respiratory: Negative for shortness of breath.     Allergies  Review of patient's allergies indicates no known allergies.  Home Medications   Current Outpatient Rx  Name  Route  Sig  Dispense  Refill  . acetaminophen (TYLENOL) 500 MG tablet   Oral   Take 1,000 mg by mouth every 6 (six) hours as needed for pain.         . clindamycin (CLEOCIN) 150 MG capsule   Oral   Take 2 capsules (300 mg total) by mouth 4 (four) times daily.   56 capsule   0   . HYDROcodone-acetaminophen (NORCO/VICODIN) 5-325 MG per tablet   Oral   Take 1 tablet by mouth every 4 (four) hours as needed for pain.   20 tablet   0     BP 150/84   Pulse 66  Resp 20  Ht 5\' 11"  (1.803 m)  Wt 170 lb (77.111 kg)  BMI 23.72 kg/m2  SpO2 98%  Physical Exam  Constitutional: He is oriented to person, place, and time. He appears well-developed and well-nourished. No distress.  HENT:  Head: Normocephalic and atraumatic.  Right Ear: Tympanic membrane and external ear normal.  Left Ear: Tympanic membrane and external ear normal.  Mouth/Throat: Oropharynx is clear and moist and mucous membranes are normal. No oral lesions. Dental abscesses and dental caries present.  Multiple dental caries with fractured teeth, multiple molars missing.  Patient has increased swelling and erythema along anterior lower gingival line without drainage or fluctuance.  He does have mild induration of buccal mucosa at the site.  Eyes: Conjunctivae are normal.  Neck: Normal range of motion. Neck supple.  Cardiovascular: Normal rate and normal heart sounds.   Pulmonary/Chest: Effort normal.  Abdominal: He exhibits no distension.  Musculoskeletal: Normal range of motion.  Lymphadenopathy:    He has no cervical adenopathy.  Neurological: He is alert and oriented to person, place, and time.  Skin: Skin is warm and dry. No erythema.  Psychiatric: He has a normal mood and affect.    ED Course  Procedures (including critical care time)  Labs Reviewed - No data to display No  results found.   1. Dental abscess       MDM  Patient was prescribed clindamycin and hydrocodone.  He has a Education officer, community in eating and he has been in contact with and plans to see in 9 days.  In the interim advised warm peroxide and water swish and spit treatment.  Complete entire course of antibiotics.  Return here or see his dentist for any worsening symptoms including worse facial swelling , fevers.        Burgess Amor, PA-C 05/28/12 1831

## 2012-05-28 NOTE — ED Provider Notes (Signed)
Medical screening examination/treatment/procedure(s) were performed by non-physician practitioner and as supervising physician I was immediately available for consultation/collaboration.  Christopher J. Pollina, MD 05/28/12 2333 

## 2012-05-28 NOTE — ED Notes (Signed)
nad noted prior to dc. Dc instructions reviewed and explained. Dental referral given to pt and 2 scripts given as well.

## 2012-05-28 NOTE — ED Notes (Signed)
Dental pain x 2 days

## 2012-09-01 ENCOUNTER — Encounter (HOSPITAL_COMMUNITY): Payer: Self-pay | Admitting: Emergency Medicine

## 2012-09-01 ENCOUNTER — Emergency Department (HOSPITAL_COMMUNITY)
Admission: EM | Admit: 2012-09-01 | Discharge: 2012-09-01 | Disposition: A | Discharge status: Home / Self Care | Payer: Self-pay | Attending: Emergency Medicine | Admitting: Emergency Medicine

## 2012-09-01 DIAGNOSIS — K047 Periapical abscess without sinus: Secondary | ICD-10-CM | POA: Insufficient documentation

## 2012-09-01 DIAGNOSIS — F17200 Nicotine dependence, unspecified, uncomplicated: Secondary | ICD-10-CM | POA: Insufficient documentation

## 2012-09-01 DIAGNOSIS — Z792 Long term (current) use of antibiotics: Secondary | ICD-10-CM | POA: Insufficient documentation

## 2012-09-01 MED ORDER — NAPROXEN 500 MG PO TABS: 500.0000 mg | ORAL_TABLET | Freq: Two times a day (BID) | ORAL | Status: DC

## 2012-09-01 MED ORDER — HYDROCODONE-ACETAMINOPHEN 5-325 MG PO TABS: 2.0000 | ORAL_TABLET | ORAL | Status: DC | PRN

## 2012-09-01 MED ORDER — PENICILLIN V POTASSIUM 500 MG PO TABS: 500.0000 mg | ORAL_TABLET | Freq: Four times a day (QID) | ORAL | Status: DC

## 2012-09-01 NOTE — ED Notes (Signed)
Pt maxillary dental pain , Pt has mult dental caries with swelling of gums.  Says pain for 2 days.

## 2012-09-01 NOTE — ED Notes (Signed)
Patient c/o left upper dental pain x 2 days. Per patient dental abscess. Patient reports taking advil with no relief.

## 2012-09-01 NOTE — ED Provider Notes (Signed)
CSN: [id number]     Arrival date & time 09/01/12  1410 History     First MD Initiated Contact with Patient 09/01/12 1447     Chief Complaint  Patient presents with  . Dental Pain   (Consider location/radiation/quality/duration/timing/severity/associated sxs/prior Treatment) HPI Comments: 52 year old male presents with left upper jaw swelling and tenderness around his left upper tooth which has been present for approximately 2 days. He admits to having terrible dental disease, has not seen a dentist in some time but does have an appointment within 2 weeks.  The pain is worse with palpation and chewing and temperature sensation. Denies fevers chills trismus nausea or vomiting  Patient is a 52 y.o. male presenting with tooth pain. The history is provided by the patient.  Dental Pain Associated symptoms: no facial swelling and no fever     History reviewed. No pertinent past medical history. History reviewed. No pertinent past surgical history. History reviewed. No pertinent family history. History  Substance Use Topics  . Smoking status: Current Every Day Smoker -- 1.00 packs/day for 35 years    Types: Cigarettes  . Smokeless tobacco: Never Used  . Alcohol Use: No    Review of Systems  Constitutional: Negative for fever and chills.  HENT: Positive for dental problem. Negative for sore throat, facial swelling, trouble swallowing and voice change.        Toothache  Gastrointestinal: Negative for nausea and vomiting.    Allergies  Review of patient's allergies indicates no known allergies.  Home Medications   Current Outpatient Rx  Name  Route  Sig  Dispense  Refill  . acetaminophen (TYLENOL) 500 MG tablet   Oral   Take 1,000 mg by mouth every 6 (six) hours as needed for pain.         . clindamycin (CLEOCIN) 150 MG capsule   Oral   Take 2 capsules (300 mg total) by mouth 4 (four) times daily.   56 capsule   0   . HYDROcodone-acetaminophen (NORCO/VICODIN) 5-325 MG per  tablet   Oral   Take 1 tablet by mouth every 4 (four) hours as needed for pain.   20 tablet   0   . HYDROcodone-acetaminophen (NORCO/VICODIN) 5-325 MG per tablet   Oral   Take 2 tablets by mouth every 4 (four) hours as needed for pain.   10 tablet   0   . naproxen (NAPROSYN) 500 MG tablet   Oral   Take 1 tablet (500 mg total) by mouth 2 (two) times daily with a meal.   30 tablet   0   . penicillin v potassium (VEETID) 500 MG tablet   Oral   Take 1 tablet (500 mg total) by mouth 4 (four) times daily.   40 tablet   0    BP 103/70  Pulse 75  Temp(Src) 97.7 F (36.5 C) (Oral)  Resp 18  Ht 5\' 10"  (1.778 m)  Wt 160 lb (72.576 kg)  BMI 22.96 kg/m2  SpO2 100% Physical Exam  Nursing note and vitals reviewed. Constitutional: He appears well-developed and well-nourished. No distress.  HENT:  Head: Normocephalic and atraumatic.  Mouth/Throat: Oropharynx is clear and moist. No oropharyngeal exudate.  Dental Disease diffuse, severe degradation of the teeth down to the gum line on both spaces, left upper maxillary jaw with mild phlegmonous abscessed area without fluctuance or asymmetry. The swelling is not seen on the external exam, there is no jaw asymmetry  Eyes: Conjunctivae are normal. No scleral icterus.  Neck: Normal range of motion. Neck supple. No thyromegaly present.  No trismus or torticollis, no lymphadenopathy  Cardiovascular: Normal rate and regular rhythm.   Pulmonary/Chest: Effort normal and breath sounds normal.  Lymphadenopathy:    He has no cervical adenopathy.  Neurological: He is alert.  Skin: Skin is warm and dry. No rash noted. He is not diaphoretic.    ED Course   Procedures (including critical care time)  Labs Reviewed - No data to display No results found. 1. Dental abscess     MDM  Overall the patient is well-appearing, vital signs are normal, no fever or tachycardia, no nausea or vomiting, dental abscess to be treated with penicillin, pain  medications and followup with the dentist whom he says he has already contacted.  Meds given in ED:  Medications - No data to display  New Prescriptions   HYDROCODONE-ACETAMINOPHEN (NORCO/VICODIN) 5-325 MG PER TABLET    Take 2 tablets by mouth every 4 (four) hours as needed for pain.   NAPROXEN (NAPROSYN) 500 MG TABLET    Take 1 tablet (500 mg total) by mouth 2 (two) times daily with a meal.   PENICILLIN V POTASSIUM (VEETID) 500 MG TABLET    Take 1 tablet (500 mg total) by mouth 4 (four) times daily.      Vida Roller, MD 09/01/12 [phone]

## 2012-10-26 ENCOUNTER — Emergency Department (HOSPITAL_COMMUNITY)
Admission: EM | Admit: 2012-10-26 | Discharge: 2012-10-26 | Disposition: A | Discharge status: Home / Self Care | Payer: BC Managed Care – PPO | Attending: Emergency Medicine | Admitting: Emergency Medicine

## 2012-10-26 ENCOUNTER — Encounter (HOSPITAL_COMMUNITY): Payer: Self-pay | Admitting: Emergency Medicine

## 2012-10-26 DIAGNOSIS — F17200 Nicotine dependence, unspecified, uncomplicated: Secondary | ICD-10-CM | POA: Insufficient documentation

## 2012-10-26 DIAGNOSIS — Z792 Long term (current) use of antibiotics: Secondary | ICD-10-CM | POA: Insufficient documentation

## 2012-10-26 DIAGNOSIS — IMO0002 Reserved for concepts with insufficient information to code with codable children: Secondary | ICD-10-CM | POA: Insufficient documentation

## 2012-10-26 DIAGNOSIS — L02414 Cutaneous abscess of left upper limb: Secondary | ICD-10-CM

## 2012-10-26 DIAGNOSIS — Z791 Long term (current) use of non-steroidal anti-inflammatories (NSAID): Secondary | ICD-10-CM | POA: Insufficient documentation

## 2012-10-26 MED ORDER — HYDROCODONE-ACETAMINOPHEN 5-325 MG PO TABS: ORAL_TABLET | ORAL | Status: DC

## 2012-10-26 MED ORDER — SULFAMETHOXAZOLE-TMP DS 800-160 MG PO TABS: 1.0000 | ORAL_TABLET | Freq: Two times a day (BID) | ORAL | Status: DC

## 2012-10-26 MED ORDER — HYDROCODONE-ACETAMINOPHEN 5-325 MG PO TABS
1.0000 | ORAL_TABLET | Freq: Once | ORAL | Status: AC
  Administered 2012-10-26: 1 via ORAL
  Filled 2012-10-26: qty 1

## 2012-10-26 MED ORDER — LIDOCAINE HCL (PF) 1 % IJ SOLN
INTRAMUSCULAR | Status: AC
  Administered 2012-10-26: 23:00:00
  Filled 2012-10-26: qty 5

## 2012-10-26 MED ORDER — SULFAMETHOXAZOLE-TMP DS 800-160 MG PO TABS
1.0000 | ORAL_TABLET | Freq: Once | ORAL | Status: AC
  Administered 2012-10-26: 1 via ORAL
  Filled 2012-10-26: qty 1

## 2012-10-26 NOTE — ED Provider Notes (Signed)
CSN: [id number]     Arrival date & time 10/26/12  2109 History  This chart was scribed for non-physician practitioner Pauline Aus, PA-C working with Laray Anger, DO by Danella Maiers, ED Scribe. This patient was seen in room APFT20/APFT20 and the patient's care was started at 9:44 PM.   Chief Complaint  Patient presents with  . Abscess   Patient is a 52 y.o. male presenting with abscess. The history is provided by the patient. No language interpreter was used.  Abscess Location:  Shoulder/arm Shoulder/arm abscess location:  L forearm Abscess quality: redness   Abscess quality: not draining   Duration:  2 days Progression:  Worsening Context: insect bite/sting   Associated symptoms: no fever, no nausea and no vomiting    HPI Comments: TRAMPUS MCQUERRY is a 52 y.o. male who presents to the Emergency Department complaining of a constant, worsening abscess to the left forearm after a subjective insect bite two days ago. Pt states he felt a sting while sitting on the porch and saw a black insect but it was gone before he saw what it was. He states it started out as a little red dot and has since grown bigger and more painful. He denies itching. He denies h/o MRSA or abscesses. He denies h/o diabetes. He denies pain with ROM or pain anywhere else in his arm. He denies fevers or chills. He has been putting peroxide and alcohol on the area. Pt notes associated redness and swelling to the area. He denies any drainage. He is otherwise healthy with no medical problems.  History reviewed. No pertinent past medical history. History reviewed. No pertinent past surgical history. History reviewed. No pertinent family history. History  Substance Use Topics  . Smoking status: Current Every Day Smoker -- 1.00 packs/day for 35 years    Types: Cigarettes  . Smokeless tobacco: Never Used  . Alcohol Use: No    Review of Systems  Constitutional: Negative for fever and chills.  Gastrointestinal:  Negative for nausea, vomiting and diarrhea.  Skin: Positive for wound (abscess).  Neurological: Negative for weakness and numbness.  All other systems reviewed and are negative.    Allergies  Review of patient's allergies indicates no known allergies.  Home Medications   Current Outpatient Rx  Name  Route  Sig  Dispense  Refill  . acetaminophen (TYLENOL) 500 MG tablet   Oral   Take 1,000 mg by mouth every 6 (six) hours as needed for pain.         . clindamycin (CLEOCIN) 150 MG capsule   Oral   Take 2 capsules (300 mg total) by mouth 4 (four) times daily.   56 capsule   0   . HYDROcodone-acetaminophen (NORCO/VICODIN) 5-325 MG per tablet   Oral   Take 1 tablet by mouth every 4 (four) hours as needed for pain.   20 tablet   0   . HYDROcodone-acetaminophen (NORCO/VICODIN) 5-325 MG per tablet   Oral   Take 2 tablets by mouth every 4 (four) hours as needed for pain.   10 tablet   0   . naproxen (NAPROSYN) 500 MG tablet   Oral   Take 1 tablet (500 mg total) by mouth 2 (two) times daily with a meal.   30 tablet   0   . penicillin v potassium (VEETID) 500 MG tablet   Oral   Take 1 tablet (500 mg total) by mouth 4 (four) times daily.   40 tablet  0    BP 122/82  Pulse 82  Temp(Src) 97.5 F (36.4 C) (Oral)  Resp 20  Ht 5\' 10"  (1.778 m)  Wt 170 lb (77.111 kg)  BMI 24.39 kg/m2  SpO2 99% Physical Exam  Nursing note and vitals reviewed. Constitutional: He is oriented to person, place, and time. He appears well-developed and well-nourished. No distress.  HENT:  Head: Normocephalic and atraumatic.  Cardiovascular: Normal rate, regular rhythm and normal heart sounds.   No murmur heard. Pulmonary/Chest: Effort normal and breath sounds normal. No respiratory distress.  Neurological: He is alert and oriented to person, place, and time. He exhibits normal muscle tone. Coordination normal.  Skin: Skin is warm and dry. There is erythema.  3 cm Abscess to the mid left  forearm with mild to moderate surrounding erythema and mild soft tissue swelling.  Induration present w/o fluctuance.   Radial pulses brisk. Cap refill less than two sec. Left elbow and wrist are nontender and pt has full ROM    ED Course  Procedures (including critical care time) Medications - No data to display  DIAGNOSTIC STUDIES: Oxygen Saturation is 99% on RA, normal by my interpretation.    COORDINATION OF CARE: 10:07 PM- Discussed treatment plan with pt which includes I&D and treatment with antibiotics and pain meds. Pt agrees to plan.  INCISION AND DRAINAGE Performed by: Pauline Aus, PA-C Consent: Verbal consent obtained. Risks and benefits: risks, benefits and alternatives were discussed Type: abscess  Body area: mid left forearm  Anesthesia: local infiltration  Incision was made with a #11 scalpel.  Local anesthetic: lidocaine 1% without epinephrine  Anesthetic total: 1 ml  Complexity: complex Blunt dissection to break up loculations  Drainage: purulent and bloody  Drainage amount: scant  Packing material: none Patient tolerance: Patient tolerated the procedure well with no immediate complications.    Labs Review Labs Reviewed - No data to display Imaging Review No results found.  MDM   I&D of small abscess to left forearm, pt agrees to warm soaks/compresses , antibiotics and to return here in 2 days for recheck if not improving. VSS he appears stable for d/c    I personally performed the services described in this documentation, which was scribed in my presence. The recorded information has been reviewed and is accurate.    Dane Kopke L. Nikita Surman, PA-C 10/28/12 1333

## 2012-10-26 NOTE — ED Notes (Signed)
Pt states "I think I was bitten by a spider." Reddened, raised area noted to left forearm. Denies drainage

## 2012-10-28 NOTE — ED Provider Notes (Signed)
Medical screening examination/treatment/procedure(s) were performed by non-physician practitioner and as supervising physician I was immediately available for consultation/collaboration.   Laray Anger, DO 10/28/12 1341

## 2013-01-07 ENCOUNTER — Emergency Department (HOSPITAL_COMMUNITY)
Admission: EM | Admit: 2013-01-07 | Discharge: 2013-01-07 | Disposition: A | Discharge status: Home / Self Care | Payer: BC Managed Care – PPO | Attending: Emergency Medicine | Admitting: Emergency Medicine

## 2013-01-07 ENCOUNTER — Encounter (HOSPITAL_COMMUNITY): Payer: Self-pay | Admitting: Emergency Medicine

## 2013-01-07 DIAGNOSIS — Z792 Long term (current) use of antibiotics: Secondary | ICD-10-CM | POA: Insufficient documentation

## 2013-01-07 DIAGNOSIS — F17200 Nicotine dependence, unspecified, uncomplicated: Secondary | ICD-10-CM | POA: Insufficient documentation

## 2013-01-07 DIAGNOSIS — K029 Dental caries, unspecified: Secondary | ICD-10-CM | POA: Insufficient documentation

## 2013-01-07 DIAGNOSIS — R509 Fever, unspecified: Secondary | ICD-10-CM | POA: Insufficient documentation

## 2013-01-07 DIAGNOSIS — K047 Periapical abscess without sinus: Secondary | ICD-10-CM | POA: Insufficient documentation

## 2013-01-07 MED ORDER — TRAMADOL HCL 50 MG PO TABS: 50.0000 mg | ORAL_TABLET | Freq: Four times a day (QID) | ORAL | Status: DC | PRN

## 2013-01-07 MED ORDER — AMOXICILLIN 500 MG PO CAPS: 500.0000 mg | ORAL_CAPSULE | Freq: Three times a day (TID) | ORAL | Status: AC

## 2013-01-07 MED ORDER — AMOXICILLIN 250 MG PO CAPS
500.0000 mg | ORAL_CAPSULE | Freq: Once | ORAL | Status: AC
  Administered 2013-01-07: 500 mg via ORAL
  Filled 2013-01-07: qty 2

## 2013-01-07 MED ORDER — TRAMADOL HCL 50 MG PO TABS
50.0000 mg | ORAL_TABLET | Freq: Once | ORAL | Status: AC
  Administered 2013-01-07: 50 mg via ORAL
  Filled 2013-01-07: qty 1

## 2013-01-07 NOTE — ED Notes (Signed)
Pt c/o L lower jaw dental pain, onset Wed. To have 5 teeth pulled on Fri.

## 2013-01-07 NOTE — ED Provider Notes (Signed)
Medical screening examination/treatment/procedure(s) were performed by non-physician practitioner and as supervising physician I was immediately available for consultation/collaboration.  EKG Interpretation   None       Devoria Albe, MD, Armando Gang   Ward Givens, MD 01/07/13 2237

## 2013-01-07 NOTE — ED Provider Notes (Signed)
CSN: [id number]     Arrival date & time 01/07/13  2036 History   First MD Initiated Contact with Patient 01/07/13 2050     Chief Complaint  Patient presents with  . Dental Pain   (Consider location/radiation/quality/duration/timing/severity/associated sxs/prior Treatment) HPI Comments: Alan Stuart is a 52 y.o. Male presenting with a 3 day history of dental pain and gingival swelling.   The patient has a history of decay of his lower anterior teeth for which he is scheduled to have dental extractions in 1 week in anticipation of a partial.  He has had increased pain and now swelling of his left lateral incisor and surrounding gums since yesterday.    He reports fever of 100.3 last night.  He denies nausea or vomiting, also no complaint of difficulty swallowing,  Although chewing makes pain worse.  The patient has tried aleve without relief of symptoms.         The history is provided by the patient.    History reviewed. No pertinent past medical history. History reviewed. No pertinent past surgical history. Family History  Problem Relation Age of Onset  . Diabetes Mother    History  Substance Use Topics  . Smoking status: Current Every Day Smoker -- 1.00 packs/day for 35 years    Types: Cigarettes  . Smokeless tobacco: Never Used  . Alcohol Use: No    Review of Systems  Constitutional: Positive for fever.  HENT: Positive for dental problem. Negative for facial swelling and sore throat.   Respiratory: Negative for shortness of breath.   Musculoskeletal: Negative for neck pain and neck stiffness.    Allergies  Review of patient's allergies indicates no known allergies.  Home Medications   Current Outpatient Rx  Name  Route  Sig  Dispense  Refill  . acetaminophen (TYLENOL) 500 MG tablet   Oral   Take 1,000 mg by mouth every 6 (six) hours as needed for pain.         Marland Kitchen amoxicillin (AMOXIL) 500 MG capsule   Oral   Take 1 capsule (500 mg total) by mouth 3 (three)  times daily.   30 capsule   0   . HYDROcodone-acetaminophen (NORCO/VICODIN) 5-325 MG per tablet      Take one-two tabs po q 4-6 hrs prn pain   12 tablet   0   . sulfamethoxazole-trimethoprim (BACTRIM DS) 800-160 MG per tablet   Oral   Take 1 tablet by mouth 2 (two) times daily. For 10 days   20 tablet   0   . traMADol (ULTRAM) 50 MG tablet   Oral   Take 1 tablet (50 mg total) by mouth every 6 (six) hours as needed.   15 tablet   0    BP 142/94  Pulse 75  Temp(Src) 98.4 F (36.9 C) (Oral)  Resp 24  Ht 5\' 11"  (1.803 m)  Wt 162 lb (73.483 kg)  BMI 22.60 kg/m2  SpO2 97% Physical Exam  Constitutional: He is oriented to person, place, and time. He appears well-developed and well-nourished. No distress.  HENT:  Head: Normocephalic and atraumatic.  Right Ear: Tympanic membrane and external ear normal.  Left Ear: Tympanic membrane and external ear normal.  Mouth/Throat: Oropharynx is clear and moist and mucous membranes are normal. No oral lesions. Dental abscesses present.  Very poor dentition.  All incisors in various stages of decay.  Gingival edema and erythema surrounding left lower lateral incisor and premolar area.  No drainage or fluctuance.  Eyes: Conjunctivae are normal.  Neck: Normal range of motion. Neck supple.  Cardiovascular: Normal rate and normal heart sounds.   Pulmonary/Chest: Effort normal.  Abdominal: He exhibits no distension.  Musculoskeletal: Normal range of motion.  Lymphadenopathy:    He has no cervical adenopathy.  Neurological: He is alert and oriented to person, place, and time.  Skin: Skin is warm and dry. No erythema.  Psychiatric: He has a normal mood and affect.    ED Course  Procedures (including critical care time) Labs Review Labs Reviewed - No data to display Imaging Review No results found.  EKG Interpretation   None       MDM   1. Dental abscess    Amoxil, tramadol.  Pt has dental f/u with Dr Laural Benes in 6 days.  The  patient appears reasonably screened and/or stabilized for discharge and I doubt any other medical condition or other [facility] requiring further screening, evaluation, or treatment in the ED at this time prior to discharge.     Burgess Amor, PA-C 01/07/13 2115

## 2013-01-07 NOTE — Discharge Instructions (Signed)
Abscessed Tooth  An abscessed tooth is an infection around your tooth. It may be caused by holes or damage to the tooth (cavity) or a dental disease. An abscessed tooth causes mild to very bad pain in and around the tooth. See your dentist right away if you have tooth or gum pain.  HOME CARE   Take your medicine as told. Finish it even if you start to feel better.   Do not drive after taking pain medicine.   Rinse your mouth (gargle) often with salt water ( teaspoon salt in 8 ounces of warm water).   Do not apply heat to the outside of your face.  GET HELP RIGHT AWAY IF:    You have a temperature by mouth above 102 F (38.9 C), not controlled by medicine.   You have chills and a very bad headache.   You have problems breathing or swallowing.   Your mouth will not open.   You develop puffiness (swelling) on the neck or around the eye.   Your pain is not helped by medicine.   Your pain is getting worse instead of better.  MAKE SURE YOU:    Understand these instructions.   Will watch your condition.   Will get help right away if you are not doing well or get worse.  Document Released: 06/24/2007 Document Revised: 03/30/2011 Document Reviewed: 04/15/2010  [employer] Patient Information 2014 [employer]

## 2013-07-03 IMAGING — CR DG TIBIA/FIBULA 2V*L*
2 series · 2 of 2 positions shown · non-contrast
Comparison: None.

CLINICAL DATA: Fall with leg pain.

LEFT TIBIA AND FIBULA - 2 VIEW

[view not recorded (1 of 2)]
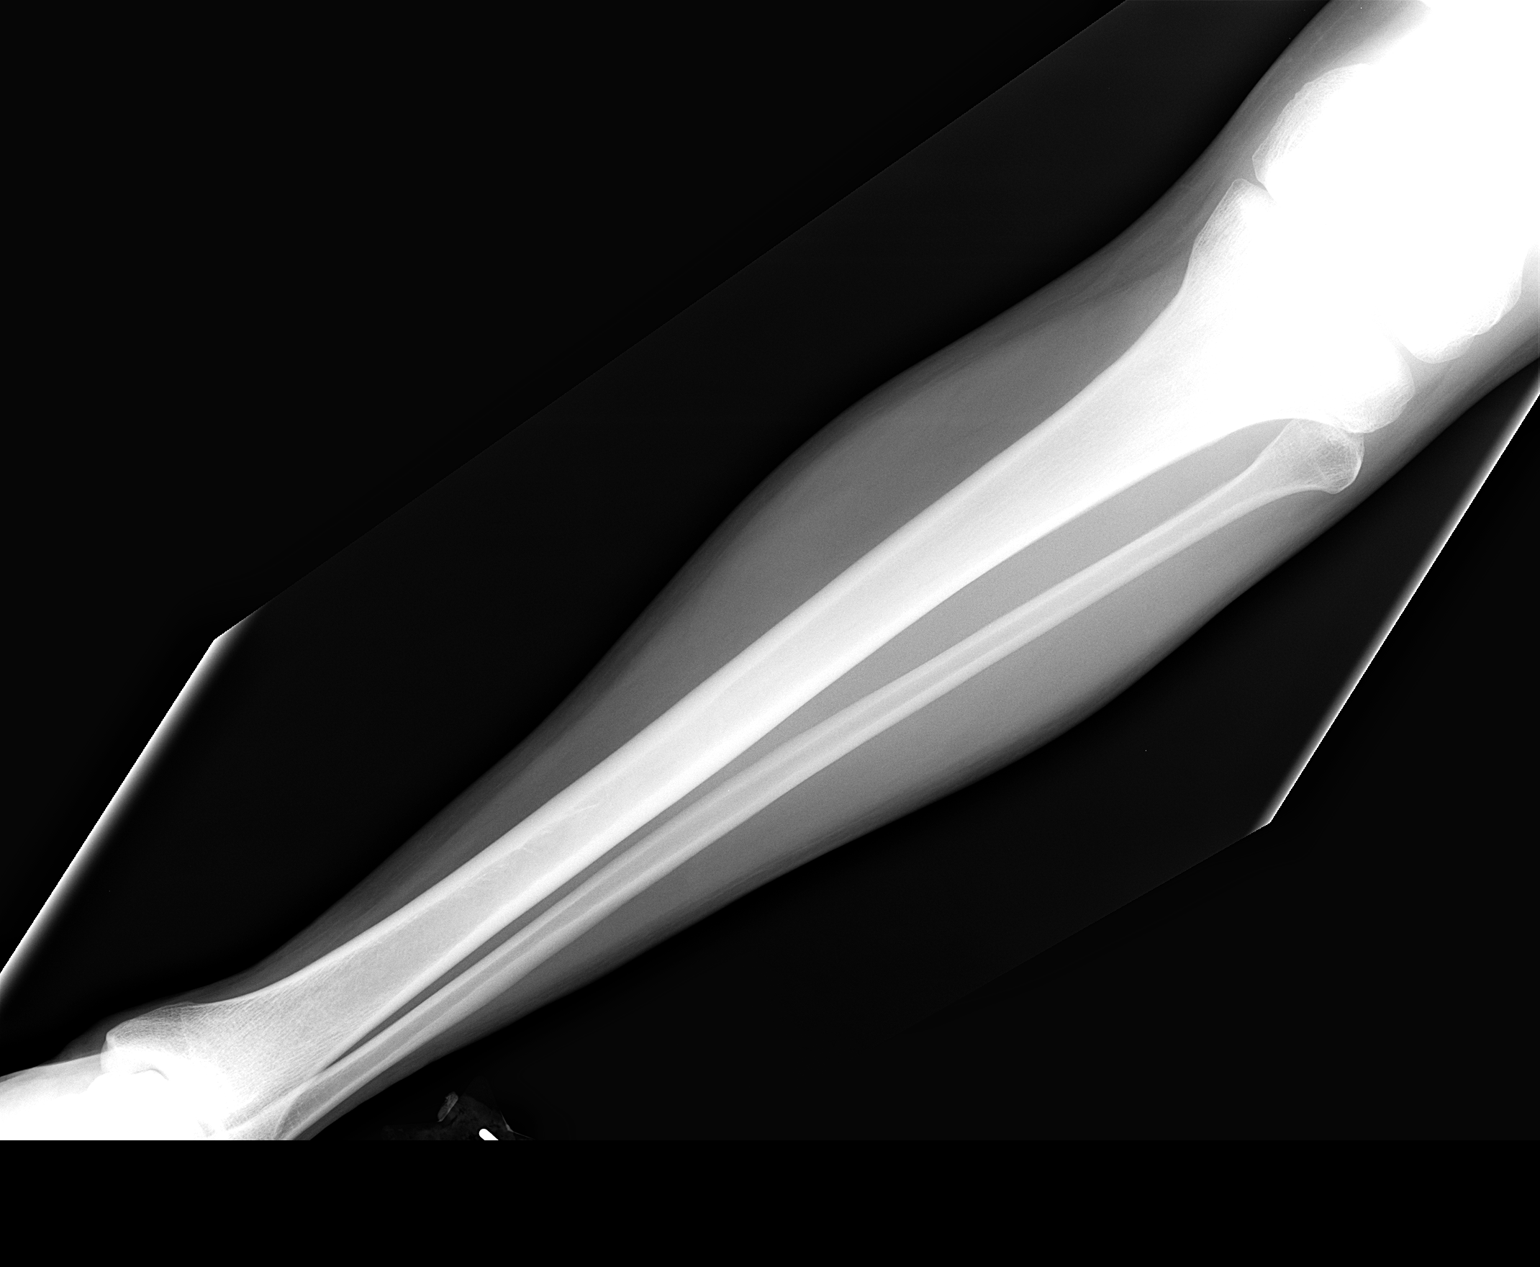

[view not recorded (2 of 2)]
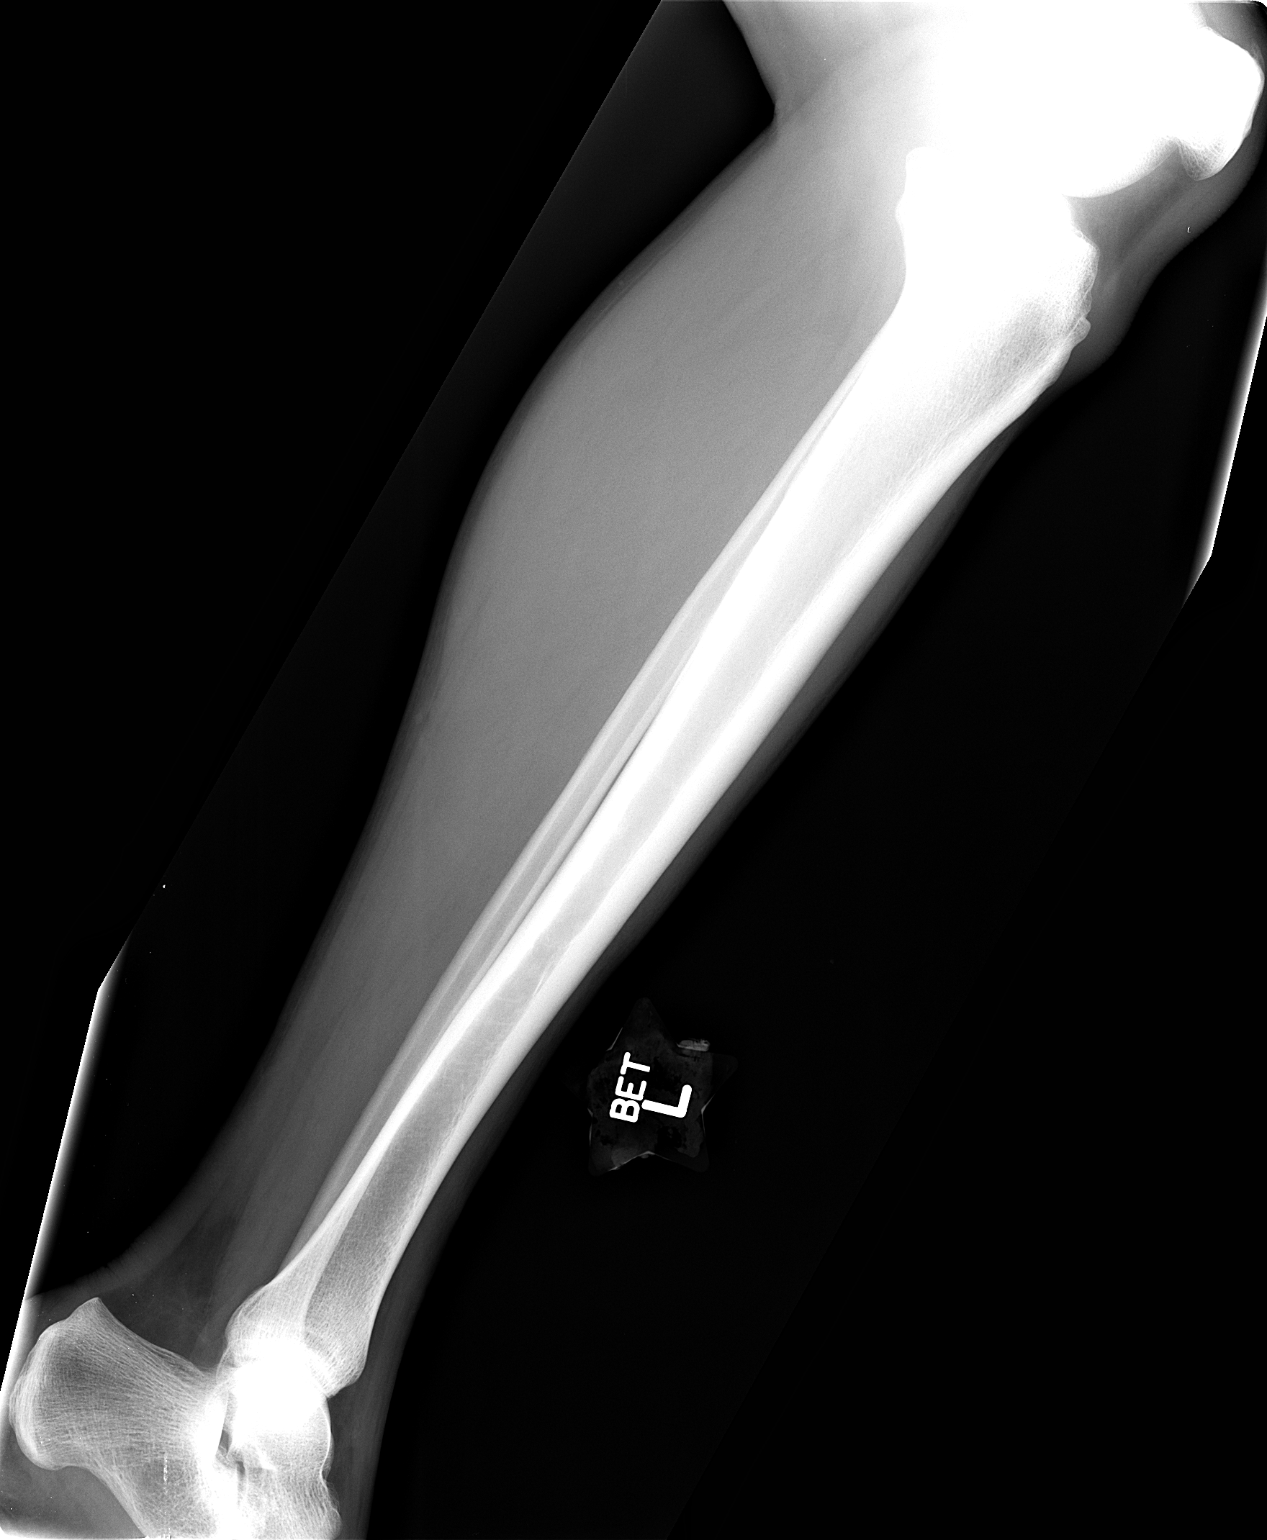

[2 of 2 positions shown; findings below may reference images not displayed]

FINDINGS: No acute fracture.
IMPRESSION: No acute fracture.

## 2014-02-03 ENCOUNTER — Emergency Department (HOSPITAL_COMMUNITY)
Admission: EM | Admit: 2014-02-03 | Discharge: 2014-02-03 | Disposition: A | Discharge status: Home / Self Care | Payer: BLUE CROSS/BLUE SHIELD | Attending: Emergency Medicine | Admitting: Emergency Medicine

## 2014-02-03 DIAGNOSIS — Z720 Tobacco use: Secondary | ICD-10-CM | POA: Insufficient documentation

## 2014-02-03 DIAGNOSIS — K088 Other specified disorders of teeth and supporting structures: Secondary | ICD-10-CM | POA: Diagnosis present

## 2014-02-03 DIAGNOSIS — K047 Periapical abscess without sinus: Secondary | ICD-10-CM | POA: Diagnosis not present

## 2014-02-03 MED ORDER — AMOXICILLIN 500 MG PO CAPS: 500.0000 mg | ORAL_CAPSULE | Freq: Three times a day (TID) | ORAL | Status: AC

## 2014-02-03 MED ORDER — TRAMADOL HCL 50 MG PO TABS
50.0000 mg | ORAL_TABLET | Freq: Four times a day (QID) | ORAL | Status: AC | PRN
Start: 2014-02-03 — End: ?

## 2014-02-03 NOTE — Discharge Instructions (Signed)

## 2014-02-03 NOTE — ED Provider Notes (Signed)
CSN: [id number]     Arrival date & time 02/03/14  1630 History   First MD Initiated Contact with Patient 02/03/14 1659     Chief Complaint  Patient presents with  . Dental Pain     (Consider location/radiation/quality/duration/timing/severity/associated sxs/prior Treatment) Patient is a 54 y.o. male presenting with tooth pain. The history is provided by the patient.  Dental Pain Location:  Lower Lower teeth location:  21/LL 1st bicuspid and 22/LL cuspid Quality:  Throbbing Severity:  Severe Onset quality:  Gradual Duration:  2 days Timing:  Constant Progression:  Worsening Chronicity:  New Context: abscess   Relieved by:  Nothing Worsened by:  Cold food/drink Ineffective treatments:  NSAIDs  Earle GellMichael W Tietze is a 54 y.o. male who presents to the ED with dental pain. The pain started 2 days and has gotten worse. He has multiple decayed teeth. He has had 4 extracted and has an appointment next week to have 3 more extracted. He states the pain got to bad to wait until his appointment.   No past medical history on file. No past surgical history on file. Family History  Problem Relation Age of Onset  . Diabetes Mother    History  Substance Use Topics  . Smoking status: Current Every Day Smoker -- 1.00 packs/day for 35 years    Types: Cigarettes  . Smokeless tobacco: Never Used  . Alcohol Use: No    Review of Systems Negative except as stated in HPI   Allergies  Review of patient's allergies indicates no known allergies.  Home Medications   Prior to Admission medications   Medication Sig Start Date End Date Taking? Authorizing Provider  amoxicillin (AMOXIL) 500 MG capsule Take 1 capsule (500 mg total) by mouth 3 (three) times daily. 02/03/14   Hope Orlene OchM Neese, NP  traMADol (ULTRAM) 50 MG tablet Take 1 tablet (50 mg total) by mouth every 6 (six) hours as needed. 02/03/14   Hope Orlene OchM Neese, NP   BP 120/66 mmHg  Pulse 75  Temp(Src) 97.5 F (36.4 C)  Resp 18  Ht 5\' 11"   (1.803 m)  Wt 160 lb (72.576 kg)  BMI 22.33 kg/m2  SpO2 99% Physical Exam  Constitutional: He is oriented to person, place, and time. He appears well-developed and well-nourished. No distress.  HENT:  Head: Normocephalic.  Mouth/Throat: Uvula is midline, oropharynx is clear and moist and mucous membranes are normal.    Multiple decayed teeth. The decay in the lower left teeth extends to the gumline. There is swelling and tenderness of the gum surrounding the decayed teeth.   Eyes: EOM are normal.  Neck: Neck supple.  Cardiovascular: Normal rate.   Pulmonary/Chest: Effort normal.  Musculoskeletal: Normal range of motion.  Neurological: He is alert and oriented to person, place, and time. No cranial nerve deficit.  Skin: Skin is warm and dry.  Psychiatric: He has a normal mood and affect. His behavior is normal.  Nursing note and vitals reviewed.   ED Course  Procedures   MDM  54 y.o. male with dental decay and abscessed tooth. Will treat for pain and infection. He will keep his appointment with the dentist next week as scheduled. Stable for discharge without fever or signs of sepsis.   Final diagnoses:  Abscessed tooth       Janne NapoleonHope M Neese, NP 02/04/14 0102  Gerhard Munchobert Lockwood, MD 02/04/14 [phone]

## 2014-02-03 NOTE — ED Notes (Signed)
Toothache left jaw

## 2014-02-06 ENCOUNTER — Emergency Department (HOSPITAL_COMMUNITY)
Admission: EM | Admit: 2014-02-06 | Discharge: 2014-02-06 | Disposition: A | Discharge status: Home / Self Care | Payer: BLUE CROSS/BLUE SHIELD | Attending: Emergency Medicine | Admitting: Emergency Medicine

## 2014-02-06 ENCOUNTER — Encounter (HOSPITAL_COMMUNITY): Payer: Self-pay | Admitting: Emergency Medicine

## 2014-02-06 DIAGNOSIS — K088 Other specified disorders of teeth and supporting structures: Secondary | ICD-10-CM | POA: Diagnosis present

## 2014-02-06 DIAGNOSIS — K029 Dental caries, unspecified: Secondary | ICD-10-CM | POA: Diagnosis not present

## 2014-02-06 DIAGNOSIS — Z792 Long term (current) use of antibiotics: Secondary | ICD-10-CM | POA: Diagnosis not present

## 2014-02-06 DIAGNOSIS — K0889 Other specified disorders of teeth and supporting structures: Secondary | ICD-10-CM

## 2014-02-06 DIAGNOSIS — Z720 Tobacco use: Secondary | ICD-10-CM | POA: Insufficient documentation

## 2014-02-06 MED ORDER — HYDROCODONE-ACETAMINOPHEN 5-325 MG PO TABS: ORAL_TABLET | ORAL | Status: DC

## 2014-02-06 NOTE — Discharge Instructions (Signed)

## 2014-02-06 NOTE — ED Notes (Signed)
Patient c/o left lower dental pain. Patient seen here on 02/03/2014. Patient states he was called for a follow-up and told to come back if he needed to.  Per patient given amoxicillin and tramadol. Patient reports "antibiotic is working well but pain medication he was given is not helping."

## 2014-02-09 NOTE — ED Provider Notes (Signed)
CSN: [id number]     Arrival date & time 02/06/14  1810 History   First MD Initiated Contact with Patient 02/06/14 1858     Chief Complaint  Patient presents with  . Dental Pain     (Consider location/radiation/quality/duration/timing/severity/associated sxs/prior Treatment) HPI   Alan Stuart is a 54 y.o. male who presents to the Emergency Department complaining of dental pain.  He was seen here several days ago for same and states the pain medication given is not helping.  He is currently taking antibiotics.  He denies fever, facial or gum swelling.  He states he has an appt with a dentist on Friday of this week.     History reviewed. No pertinent past medical history. History reviewed. No pertinent past surgical history. Family History  Problem Relation Age of Onset  . Diabetes Mother    History  Substance Use Topics  . Smoking status: Current Every Day Smoker -- 1.00 packs/day for 35 years    Types: Cigarettes  . Smokeless tobacco: Never Used  . Alcohol Use: No    Review of Systems  Constitutional: Negative for fever and appetite change.  HENT: Positive for dental problem. Negative for congestion, facial swelling, sore throat and trouble swallowing.   Eyes: Negative for pain and visual disturbance.  Musculoskeletal: Negative for neck pain and neck stiffness.  Neurological: Negative for dizziness, facial asymmetry and headaches.  Hematological: Negative for adenopathy.  All other systems reviewed and are negative.     Allergies  Codeine  Home Medications   Prior to Admission medications   Medication Sig Start Date End Date Taking? Authorizing Provider  amoxicillin (AMOXIL) 500 MG capsule Take 1 capsule (500 mg total) by mouth 3 (three) times daily. 02/03/14  Yes Hope Orlene Och, NP  traMADol (ULTRAM) 50 MG tablet Take 1 tablet (50 mg total) by mouth every 6 (six) hours as needed. 02/03/14  Yes Hope Orlene Och, NP  HYDROcodone-acetaminophen (NORCO/VICODIN) 5-325 MG per  tablet Take one-two tabs po q 4-6 hrs prn pain 02/06/14   Adeola Dennen L. Dagny Fiorentino, PA-C   BP 155/72 mmHg  Pulse 73  Temp(Src) 97.4 F (36.3 C) (Oral)  Resp 20  Ht  (1.778 m)  Wt 160 lb (72.576 kg)  BMI 22.96 kg/m2  SpO2 98% Physical Exam  Constitutional: He is oriented to person, place, and time. He appears well-developed and well-nourished. No distress.  HENT:  Head: Normocephalic and atraumatic.  Right Ear: Tympanic membrane and ear canal normal.  Left Ear: Tympanic membrane and ear canal normal.  Mouth/Throat: Uvula is midline, oropharynx is clear and moist and mucous membranes are normal. No trismus in the jaw. Dental caries present. No dental abscesses or uvula swelling.  Multiple dental caries and widespread dental and gum disease.Marland Kitchen  No facial swelling, obvious dental abscess, trismus, or sublingual abnml.    Neck: Normal range of motion. Neck supple.  Cardiovascular: Normal rate, regular rhythm and normal heart sounds.   No murmur heard. Pulmonary/Chest: Effort normal and breath sounds normal.  Musculoskeletal: Normal range of motion.  Lymphadenopathy:    He has no cervical adenopathy.  Neurological: He is alert and oriented to person, place, and time. He exhibits normal muscle tone. Coordination normal.  Skin: Skin is warm and dry.  Nursing note and vitals reviewed.   ED Course  Procedures (including critical care time) Labs Review Labs Reviewed - No data to display  Imaging Review No results found.   EKG Interpretation None  MDM   Final diagnoses:  Pain, dental    Pt is well appearing, non-toxic.  Airway patent no concerning sx's for infection to floor of the mouth or deep structures of the neck.  Currently taking abx.  Has appt with dentist on Friday.    Raegan Sipp L. Trisha Mangleriplett, PA-C 02/09/14 [phone]  Benny LennertJoseph L Zammit, MD 02/09/14 [phone]

## 2017-06-28 ENCOUNTER — Other Ambulatory Visit: Payer: Self-pay

## 2017-06-28 ENCOUNTER — Encounter (HOSPITAL_COMMUNITY): Payer: Self-pay | Admitting: Emergency Medicine

## 2017-06-28 ENCOUNTER — Emergency Department (HOSPITAL_COMMUNITY)
Admission: EM | Admit: 2017-06-28 | Discharge: 2017-06-28 | Disposition: A | Discharge status: Home / Self Care | Payer: BLUE CROSS/BLUE SHIELD | Attending: Emergency Medicine | Admitting: Emergency Medicine

## 2017-06-28 DIAGNOSIS — F17210 Nicotine dependence, cigarettes, uncomplicated: Secondary | ICD-10-CM | POA: Insufficient documentation

## 2017-06-28 DIAGNOSIS — R0981 Nasal congestion: Secondary | ICD-10-CM | POA: Insufficient documentation

## 2017-06-28 DIAGNOSIS — K0889 Other specified disorders of teeth and supporting structures: Secondary | ICD-10-CM | POA: Insufficient documentation

## 2017-06-28 MED ORDER — CLINDAMYCIN HCL 150 MG PO CAPS
300.0000 mg | ORAL_CAPSULE | Freq: Once | ORAL | Status: AC
  Administered 2017-06-28: 300 mg via ORAL
  Filled 2017-06-28: qty 2

## 2017-06-28 MED ORDER — CLINDAMYCIN HCL 150 MG PO CAPS: 300.0000 mg | ORAL_CAPSULE | Freq: Four times a day (QID) | ORAL | 0 refills | Status: DC

## 2017-06-28 MED ORDER — OXYMETAZOLINE HCL 0.05 % NA SOLN
1.0000 | Freq: Once | NASAL | Status: AC
  Administered 2017-06-28: 1 via NASAL
  Filled 2017-06-28: qty 15

## 2017-06-28 NOTE — Discharge Instructions (Addendum)
2 to 3 sprays of the Afrin to each nostril twice a day for 3 days then stop.  Take the antibiotic as directed until it is finished.  Contact 1 of the dentist listed to arrange a follow-up.  Follow-up with your primary doctor or return to the ER for any worsening symptoms.

## 2017-06-28 NOTE — ED Provider Notes (Signed)
[facility] EMERGENCY DEPARTMENT Provider Note   CSN: [id number] Arrival date & time: 06/28/17  1903     History   Chief Complaint Chief Complaint  Patient presents with  . Dental Pain    HPI Alan Stuart is a 57 y.o. male.  HPI   Alan Stuart is a 57 y.o. male who presents to the Emergency Department complaining of dental pain and nasal congestion.  He reports history of multiple dental caries for greater than 6 months.  Pain to his teeth has been worsening and he is noticed difficulty breathing through his nose.  He states he feels that he has a abscess that is causing his nasal congestion.  He has used peroxide rinses with some relief.  He denies facial swelling, neck pain, difficulty swallowing, fever or chills and sore throat.   History reviewed. No pertinent past medical history.  There are no active problems to display for this patient.   History reviewed. No pertinent surgical history.    Home Medications    Prior to Admission medications   Medication Sig Start Date End Date Taking? Authorizing Provider  amoxicillin (AMOXIL) 500 MG capsule Take 1 capsule (500 mg total) by mouth 3 (three) times daily. 02/03/14   Janne NapoleonNeese, Hope M, NP  clindamycin (CLEOCIN) 150 MG capsule Take 2 capsules (300 mg total) by mouth 4 (four) times daily. 06/28/17   Dietrich Samuelson, PA-C  HYDROcodone-acetaminophen (NORCO/VICODIN) 5-325 MG per tablet Take one-two tabs po q 4-6 hrs prn pain 02/06/14   Victormanuel Mclure, PA-C  traMADol (ULTRAM) 50 MG tablet Take 1 tablet (50 mg total) by mouth every 6 (six) hours as needed. 02/03/14   Janne NapoleonNeese, Hope M, NP    Family History Family History  Problem Relation Age of Onset  . Diabetes Mother     Social History Social History   Tobacco Use  . Smoking status: Current Every Day Smoker    Packs/day: 1.00    Years: 35.00    Pack years: 35.00    Types: Cigarettes  . Smokeless tobacco: Never Used  Substance Use Topics  . Alcohol use: No  . Drug  use: No     Allergies   Codeine   Review of Systems Review of Systems  Constitutional: Negative for appetite change and fever.  HENT: Positive for congestion and dental problem. Negative for facial swelling, sore throat and trouble swallowing.   Eyes: Negative for pain and visual disturbance.  Musculoskeletal: Negative for neck pain and neck stiffness.  Neurological: Negative for dizziness, syncope, facial asymmetry and headaches.  Hematological: Negative for adenopathy.  All other systems reviewed and are negative.    Physical Exam Updated Vital Signs BP (!) 150/69 (BP Location: Right Arm)   Pulse 68   Temp 98.1 F (36.7 C) (Oral)   Resp 16   Ht 5\' 11"  (1.803 m)   Wt 72.6 kg (160 lb)   SpO2 99%   BMI 22.32 kg/m   Physical Exam  Constitutional: He is oriented to person, place, and time. He appears well-developed and well-nourished. No distress.  HENT:  Head: Normocephalic and atraumatic.  Right Ear: Tympanic membrane and ear canal normal.  Left Ear: Tympanic membrane and ear canal normal.  Mouth/Throat: Uvula is midline, oropharynx is clear and moist and mucous membranes are normal. No trismus in the jaw. Dental caries present. No dental abscesses or uvula swelling.  Widespread dental decay with tenderness to palpation of the remaining upper central incisors and left second premolar.  No facial swelling, obvious dental abscess, trismus, or sublingual abnml.  Diffuse edema of the nasal mucosa.  No polyps seen anteriorly.  Eyes: Pupils are equal, round, and reactive to light. EOM are normal.  Neck: Normal range of motion. Neck supple.  Cardiovascular: Normal rate, regular rhythm and normal heart sounds.  No murmur heard. Pulmonary/Chest: Effort normal and breath sounds normal.  Musculoskeletal: Normal range of motion.  Lymphadenopathy:    He has no cervical adenopathy.  Neurological: He is alert and oriented to person, place, and time. He exhibits normal muscle tone.  Coordination normal.  Skin: Skin is warm and dry.  Nursing note and vitals reviewed.    ED Treatments / Results  Labs (all labs ordered are listed, but only abnormal results are displayed) Labs Reviewed - No data to display  EKG None  Radiology No results found.  Procedures Procedures (including critical care time)  Medications Ordered in ED Medications  oxymetazoline (AFRIN) 0.05 % nasal spray 1 spray (has no administration in time range)  clindamycin (CLEOCIN) capsule 300 mg (has no administration in time range)     Initial Impression / Assessment and Plan / ED Course  I have reviewed the triage vital signs and the nursing notes.  Pertinent labs & imaging results that were available during my care of the patient were reviewed by me and considered in my medical decision making (see chart for details).     Patient well-appearing.  Vital signs stable.  Multiple dental caries with possible developing dental abscess.  It is unclear if nasal congestion is secondary to his dental symptoms, I have discussed the possibility of congestion secondary to nasal polyps.  I feel that a course of antibiotics is worthwhile given his poor dental hygiene and likely infection.  I have dispensed Afrin with strict instructions for use for 3 days then discontinue he agrees to follow-up with a dentist and his PCP if needed.  Final Clinical Impressions(s) / ED Diagnoses   Final diagnoses:  Pain, dental  Nasal congestion    ED Discharge Orders        Ordered    clindamycin (CLEOCIN) 150 MG capsule  4 times daily     06/28/17 2025       Pauline Aus, PA-C 06/28/17 2036    Samuel Jester, DO 07/03/17 2338

## 2017-06-28 NOTE — ED Triage Notes (Signed)
Pt states he has two broken teeth and states he has an abscess, teeth have been broken for 6 months. Also c/o nasal congestion. No OTC medications.

## 2018-06-15 ENCOUNTER — Emergency Department (HOSPITAL_COMMUNITY)
Admission: EM | Admit: 2018-06-15 | Discharge: 2018-06-15 | Disposition: A | Discharge status: Home / Self Care | Payer: Self-pay | Attending: Emergency Medicine | Admitting: Emergency Medicine

## 2018-06-15 ENCOUNTER — Encounter (HOSPITAL_COMMUNITY): Payer: Self-pay | Admitting: Emergency Medicine

## 2018-06-15 ENCOUNTER — Other Ambulatory Visit: Payer: Self-pay

## 2018-06-15 DIAGNOSIS — K029 Dental caries, unspecified: Secondary | ICD-10-CM | POA: Insufficient documentation

## 2018-06-15 DIAGNOSIS — K047 Periapical abscess without sinus: Secondary | ICD-10-CM | POA: Insufficient documentation

## 2018-06-15 DIAGNOSIS — F17210 Nicotine dependence, cigarettes, uncomplicated: Secondary | ICD-10-CM | POA: Insufficient documentation

## 2018-06-15 MED ORDER — HYDROCODONE-ACETAMINOPHEN 5-325 MG PO TABS
1.0000 | ORAL_TABLET | ORAL | 0 refills | Status: AC | PRN
Start: 2018-06-15 — End: ?

## 2018-06-15 MED ORDER — CLINDAMYCIN HCL 150 MG PO CAPS: 300.0000 mg | ORAL_CAPSULE | Freq: Four times a day (QID) | ORAL | 0 refills | Status: AC

## 2018-06-15 NOTE — ED Triage Notes (Signed)
Pt states that he has an abscess tooth on the lower right side.

## 2018-06-15 NOTE — Discharge Instructions (Addendum)
Complete your entire course of antibiotics as prescribed.  You  may use the hydrocodone for pain relief but do not drive within 4 hours of taking as this will make you drowsy.  Avoid applying heat or ice to this abscess area which can worsen your symptoms.  You may use warm salt water swish and spit treatment or half peroxide and water swish and spit after meals to keep this area clean as discussed.  Call the dentist listed above for further management of your symptoms.  

## 2018-06-15 NOTE — ED Provider Notes (Signed)
[facility] EMERGENCY DEPARTMENT Provider Note   CSN: [id number] Arrival date & time: 06/15/18  1306    History   Chief Complaint Chief Complaint  Patient presents with  . Dental Pain    HPI Alan Stuart is a 58 y.o. male with a history of poor dentition who is mostly edentulous and was anticipating extraction of his remaining lower anterior decayed teeth which has been delayed secondary to dental service shut down during the Covid pandemic.  He reports having increased pain and swelling of the gingiva surrounding his right lower anterior incisors.  There has been no drainage from the gum line, and he denies fevers, chills or difficulty swallowing.  He has taken tylenol without improvement in pain.      The history is provided by the patient.    History reviewed. No pertinent past medical history.  There are no active problems to display for this patient.   History reviewed. No pertinent surgical history.      Home Medications    Prior to Admission medications   Medication Sig Start Date End Date Taking? Authorizing Provider  amoxicillin (AMOXIL) 500 MG capsule Take 1 capsule (500 mg total) by mouth 3 (three) times daily. 02/03/14   Janne Napoleon, NP  clindamycin (CLEOCIN) 150 MG capsule Take 2 capsules (300 mg total) by mouth every 6 (six) hours for 7 days. 06/15/18 06/22/18  Burgess Amor, PA-C  HYDROcodone-acetaminophen (NORCO/VICODIN) 5-325 MG tablet Take 1 tablet by mouth every 4 (four) hours as needed. 06/15/18   Burgess Amor, PA-C  traMADol (ULTRAM) 50 MG tablet Take 1 tablet (50 mg total) by mouth every 6 (six) hours as needed. 02/03/14   Janne Napoleon, NP    Family History Family History  Problem Relation Age of Onset  . Diabetes Mother     Social History Social History   Tobacco Use  . Smoking status: Current Every Day Smoker    Packs/day: 1.00    Years: 35.00    Pack years: 35.00    Types: Cigarettes  . Smokeless tobacco: Never Used  Substance Use  Topics  . Alcohol use: No  . Drug use: No     Allergies   Codeine   Review of Systems Review of Systems  Constitutional: Negative for fever.  HENT: Positive for dental problem. Negative for facial swelling and sore throat.   Respiratory: Negative for shortness of breath.   Musculoskeletal: Negative for neck pain and neck stiffness.     Physical Exam Updated Vital Signs BP 134/76 (BP Location: Right Arm)   Pulse 77   Temp 98.1 F (36.7 C) (Oral)   Resp 16   Ht 5\' 11"  (1.803 m)   Wt 74.8 kg   SpO2 95%   BMI 23.01 kg/m   Physical Exam Vitals signs and nursing note reviewed.  Constitutional:      General: He is not in acute distress.    Appearance: He is well-developed.  HENT:     Head: Normocephalic and atraumatic.     Jaw: No trismus.     Comments: Pt is edentulous except for remaining and very decayed lower anterior incisors.  Gingival edema and erythema right lower location without fluctuance, pointing or identifiable abscess pocket.  Sublingual space soft. Also has deep appearing roots left upper anterior and lateral incisor.     Right Ear: Tympanic membrane and external ear normal.     Left Ear: Tympanic membrane and external ear normal.  Mouth/Throat:     Mouth: No oral lesions.     Dentition: Dental abscesses present.  Eyes:     Conjunctiva/sclera: Conjunctivae normal.  Neck:     Musculoskeletal: Normal range of motion and neck supple.  Cardiovascular:     Rate and Rhythm: Normal rate.  Pulmonary:     Effort: Pulmonary effort is normal.  Lymphadenopathy:     Cervical: No cervical adenopathy.  Skin:    General: Skin is warm and dry.     Findings: No erythema.  Neurological:     Mental Status: He is alert and oriented to person, place, and time.      ED Treatments / Results  Labs (all labs ordered are listed, but only abnormal results are displayed) Labs Reviewed - No data to display  EKG None  Radiology No results found.  Procedures  Procedures (including critical care time)  Medications Ordered in ED Medications - No data to display   Initial Impression / Assessment and Plan / ED Course  I have reviewed the triage vital signs and the nursing notes.  Pertinent labs & imaging results that were available during my care of the patient were reviewed by me and considered in my medical decision making (see chart for details).        Pt with acute dental infection, advanced dental decay, no identifiable drainable abscess. Clindamycin, hydrocodone after reviewing [place] narc database. Dental referrals given. He has previously reached out to [facility] in [place] - encouraged f/u here.   Final Clinical Impressions(s) / ED Diagnoses   Final diagnoses:  Dental abscess    ED Discharge Orders         Ordered    clindamycin (CLEOCIN) 150 MG capsule  Every 6 hours     06/15/18 1349    HYDROcodone-acetaminophen (NORCO/VICODIN) 5-325 MG tablet  Every 4 hours PRN     06/15/18 1349           Burgess Amordol, Mihailo Sage, PA-C 06/15/18 1411    Jacalyn LefevreHaviland, Porschea Borys, MD 06/15/18 1441

## 2019-02-19 ENCOUNTER — Emergency Department (HOSPITAL_COMMUNITY)
Admission: EM | Admit: 2019-02-19 | Discharge: 2019-02-19 | Disposition: A | Discharge status: Home / Self Care | Payer: Self-pay | Attending: Emergency Medicine | Admitting: Emergency Medicine

## 2019-02-19 ENCOUNTER — Encounter (HOSPITAL_COMMUNITY): Payer: Self-pay | Admitting: Emergency Medicine

## 2019-02-19 ENCOUNTER — Other Ambulatory Visit: Payer: Self-pay

## 2019-02-19 DIAGNOSIS — Z79899 Other long term (current) drug therapy: Secondary | ICD-10-CM | POA: Insufficient documentation

## 2019-02-19 DIAGNOSIS — K0889 Other specified disorders of teeth and supporting structures: Secondary | ICD-10-CM | POA: Insufficient documentation

## 2019-02-19 DIAGNOSIS — F17210 Nicotine dependence, cigarettes, uncomplicated: Secondary | ICD-10-CM | POA: Insufficient documentation

## 2019-02-19 MED ORDER — CLINDAMYCIN HCL 150 MG PO CAPS: 300.0000 mg | ORAL_CAPSULE | Freq: Four times a day (QID) | ORAL | 0 refills | Status: DC

## 2019-02-19 NOTE — ED Notes (Signed)
TT in to evauate

## 2019-02-19 NOTE — Discharge Instructions (Addendum)
Continue warm salt water rinses to your mouth.  Take the antibiotic as directed.  Follow-up with your dentist soon.

## 2019-02-19 NOTE — ED Notes (Signed)
No dentist  Poor oral care   Here for eval of dental pain to L lower molar

## 2019-02-19 NOTE — ED Provider Notes (Signed)
[facility] EMERGENCY DEPARTMENT Provider Note   CSN: [id number] Arrival date & time: 02/19/19  1209     History Chief Complaint  Patient presents with  . Dental Pain    Alan Stuart is a 59 y.o. male.  HPI      Alan Stuart is a 59 y.o. male who presents to the Emergency Department complaining of left lower dental pain that began 2 days ago.  He noticed pain to his left lower teeth associated with chewing.  He also has increased sensitivity to hot or cold foods or liquids.  He states that he has been unable to see a dentist because his work has been limited due to the Covid pandemic.  He denies facial swelling, difficulty swallowing or breathing, fever, chills, neck pain and difficulty opening or closing his mouth.  He is here requesting prescription for antibiotics.   History reviewed. No pertinent past medical history.  There are no problems to display for this patient.   History reviewed. No pertinent surgical history.     Family History  Problem Relation Age of Onset  . Diabetes Mother     Social History   Tobacco Use  . Smoking status: Current Every Day Smoker    Packs/day: 1.00    Years: 35.00    Pack years: 35.00    Types: Cigarettes  . Smokeless tobacco: Never Used  Substance Use Topics  . Alcohol use: No  . Drug use: No    Home Medications Prior to Admission medications   Medication Sig Start Date End Date Taking? Authorizing Provider  amoxicillin (AMOXIL) 500 MG capsule Take 1 capsule (500 mg total) by mouth 3 (three) times daily. 02/03/14   Janne Napoleon, NP  clindamycin (CLEOCIN) 150 MG capsule Take 2 capsules (300 mg total) by mouth 4 (four) times daily. 02/19/19   Mykiah Schmuck, PA-C  HYDROcodone-acetaminophen (NORCO/VICODIN) 5-325 MG tablet Take 1 tablet by mouth every 4 (four) hours as needed. 06/15/18   Burgess Amor, PA-C  traMADol (ULTRAM) 50 MG tablet Take 1 tablet (50 mg total) by mouth every 6 (six) hours as needed. 02/03/14   Janne Napoleon, NP    Allergies    Codeine  Review of Systems   Review of Systems  Constitutional: Negative for appetite change and fever.  HENT: Positive for dental problem. Negative for congestion, facial swelling, sore throat and trouble swallowing.   Eyes: Negative for pain and visual disturbance.  Gastrointestinal: Negative for nausea and vomiting.  Musculoskeletal: Negative for neck pain and neck stiffness.  Neurological: Negative for dizziness, facial asymmetry and headaches.  Hematological: Negative for adenopathy.    Physical Exam Updated Vital Signs BP 136/74 (BP Location: Right Arm)   Pulse 75   Temp 98 F (36.7 C) (Oral)   Resp 16   Ht 5\' 11"  (1.803 m)   Wt 74.8 kg   SpO2 99%   BMI 23.01 kg/m   Physical Exam Vitals and nursing note reviewed.  Constitutional:      General: He is not in acute distress.    Appearance: Normal appearance.  HENT:     Mouth/Throat:     Mouth: Mucous membranes are moist.     Pharynx: Oropharynx is clear. Uvula midline. No uvula swelling.     Comments: Poor general dentition.  Multiple dental caries with tenderness to palpation of the left lower cuspids.  Tenderness to palpation.  No edema or fluctuance of the gums or face.  No trismus.  Uvula is midline and nonedematous. Cardiovascular:     Rate and Rhythm: Normal rate and regular rhythm.     Pulses: Normal pulses.  Pulmonary:     Effort: Pulmonary effort is normal.  Musculoskeletal:        General: Normal range of motion.  Skin:    General: Skin is warm.     Capillary Refill: Capillary refill takes less than 2 seconds.     Findings: No erythema or rash.  Neurological:     General: No focal deficit present.     Mental Status: He is alert.     Sensory: No sensory deficit.     Motor: No weakness.     ED Results / Procedures / Treatments   Labs (all labs ordered are listed, but only abnormal results are displayed) Labs Reviewed - No data to display  EKG None  Radiology No  results found.  Procedures Procedures (including critical care time)  Medications Ordered in ED Medications - No data to display  ED Course  I have reviewed the triage vital signs and the nursing notes.  Pertinent labs & imaging results that were available during my care of the patient were reviewed by me and considered in my medical decision making (see chart for details).    MDM Rules/Calculators/A&P                      Pt is well appearing.  He has been seen here multiple times for dental pain.  He has very poor dentition and tenderness of the left lower teeth, but no concerning sx's for Ludwig's angina or definite periapical abscess.  Will tx with abx and I have encouraged him to f/u with dentistry.     Final Clinical Impression(s) / ED Diagnoses Final diagnoses:  Pain, dental    Rx / DC Orders ED Discharge Orders         Ordered    clindamycin (CLEOCIN) 150 MG capsule  4 times daily     02/19/19 1237           Kem Parkinson, PA-C 02/19/19 1437    Sherwood Gambler, MD 02/20/19 [phone]

## 2019-02-19 NOTE — ED Triage Notes (Signed)
Lower Left dental pain that began Friday night. Poor dentition noted.

## 2019-06-11 ENCOUNTER — Emergency Department (HOSPITAL_COMMUNITY)
Admission: EM | Admit: 2019-06-11 | Discharge: 2019-06-11 | Disposition: A | Discharge status: Home / Self Care | Payer: Self-pay

## 2019-06-11 NOTE — ED Triage Notes (Signed)
pt called x 2, unable to locate for triage

## 2020-01-22 ENCOUNTER — Emergency Department (HOSPITAL_COMMUNITY)
Admission: EM | Admit: 2020-01-22 | Discharge: 2020-01-22 | Disposition: A | Discharge status: Home / Self Care | Payer: Self-pay | Attending: Emergency Medicine | Admitting: Emergency Medicine

## 2020-01-22 ENCOUNTER — Other Ambulatory Visit: Payer: Self-pay

## 2020-01-22 DIAGNOSIS — F17210 Nicotine dependence, cigarettes, uncomplicated: Secondary | ICD-10-CM | POA: Insufficient documentation

## 2020-01-22 DIAGNOSIS — K0889 Other specified disorders of teeth and supporting structures: Secondary | ICD-10-CM | POA: Insufficient documentation

## 2020-01-22 DIAGNOSIS — J329 Chronic sinusitis, unspecified: Secondary | ICD-10-CM | POA: Insufficient documentation

## 2020-01-22 MED ORDER — CLINDAMYCIN HCL 150 MG PO CAPS: 300.0000 mg | ORAL_CAPSULE | Freq: Three times a day (TID) | ORAL | 0 refills | Status: AC

## 2020-01-22 MED ORDER — AFRIN 12 HOUR 0.05 % NA SOLN: 1.0000 | Freq: Two times a day (BID) | NASAL | 0 refills | Status: AC

## 2020-01-22 NOTE — ED Provider Notes (Signed)
[facility] EMERGENCY DEPARTMENT Provider Note   CSN: [id number] Arrival date & time: 01/22/20  1249     History Chief Complaint  Patient presents with  . Dental Pain    Alan Stuart is a 60 y.o. male.  HPI     Alan Stuart is a 60 y.o. male who presents to the Emergency Department complaining of left lower dental pain for 3 days.  He reports history of multiple dental caries and has a scheduled appointment to see an oral surgeon for extraction in 3 weeks.  He is here requesting antibiotics due to increasing pain of several teeth along the lower left jaw.  He describes a sharp stabbing pain worse with chewing and pain radiating toward his left ear.  He also complains of nasal congestion.  States this is a recurrent problem for him associated with chronic sinusitis and allergies.  He denies neck pain, fever, chills, difficulty swallowing, facial swelling, cough or shortness of breath.  No known Covid exposures.  He states that he is tested daily at his place of employment.    No past medical history on file.  There are no problems to display for this patient.   No past surgical history on file.     Family History  Problem Relation Age of Onset  . Diabetes Mother     Social History   Tobacco Use  . Smoking status: Current Every Day Smoker    Packs/day: 1.00    Years: 35.00    Pack years: 35.00    Types: Cigarettes  . Smokeless tobacco: Never Used  Substance Use Topics  . Alcohol use: No  . Drug use: No    Home Medications Prior to Admission medications   Medication Sig Start Date End Date Taking? Authorizing Provider  amoxicillin (AMOXIL) 500 MG capsule Take 1 capsule (500 mg total) by mouth 3 (three) times daily. 02/03/14   Ashley Murrain, NP  clindamycin (CLEOCIN) 150 MG capsule Take 2 capsules (300 mg total) by mouth 4 (four) times daily. 02/19/19   Islam Villescas, PA-C  HYDROcodone-acetaminophen (NORCO/VICODIN) 5-325 MG tablet Take 1 tablet by mouth every  4 (four) hours as needed. 06/15/18   Evalee Jefferson, PA-C  traMADol (ULTRAM) 50 MG tablet Take 1 tablet (50 mg total) by mouth every 6 (six) hours as needed. 02/03/14   Ashley Murrain, NP    Allergies    Codeine  Review of Systems   Review of Systems  Constitutional: Negative for appetite change, chills and fever.  HENT: Positive for congestion, dental problem and sinus pressure (sinus congestion). Negative for facial swelling, sore throat and trouble swallowing.   Eyes: Negative for pain and visual disturbance.  Respiratory: Negative for cough, chest tightness, shortness of breath and wheezing.   Cardiovascular: Negative for chest pain.  Gastrointestinal: Negative for diarrhea, nausea and vomiting.  Genitourinary: Negative for difficulty urinating.  Musculoskeletal: Negative for neck pain and neck stiffness.  Neurological: Negative for dizziness, numbness and headaches.  Hematological: Negative for adenopathy.    Physical Exam Updated Vital Signs BP (!) 142/75   Pulse 63   Temp 97.7 F (36.5 C) (Oral)   Resp 18   Ht 5\' 10"  (1.778 m)   Wt 72.6 kg   SpO2 98%   BMI 22.96 kg/m   Physical Exam Vitals and nursing note reviewed.  Constitutional:      Appearance: Normal appearance. He is not ill-appearing or toxic-appearing.  HENT:     Right Ear:  Tympanic membrane and ear canal normal.     Left Ear: Tympanic membrane and ear canal normal.     Nose:     Comments: Turbinates mildly edematous bilaterally    Mouth/Throat:     Mouth: Mucous membranes are moist.     Comments: Tenderness to palpation of the left lower lateral incisors and premolars.  Mild erythema of the surrounding gums.  No fluctuance.  No facial edema.  Patient has poor dentition, partially edentulous.  Multiple dental caries.  No trismus.  Uvula midline and nonedematous. Eyes:     Conjunctiva/sclera: Conjunctivae normal.  Cardiovascular:     Rate and Rhythm: Normal rate and regular rhythm.     Pulses: Normal pulses.   Pulmonary:     Effort: No respiratory distress.     Breath sounds: Normal breath sounds. No wheezing.  Chest:     Chest wall: No tenderness.  Musculoskeletal:        General: Normal range of motion.     Cervical back: Normal range of motion. No tenderness.  Lymphadenopathy:     Cervical: No cervical adenopathy.  Skin:    General: Skin is warm.     Capillary Refill: Capillary refill takes less than 2 seconds.     Findings: No rash.  Neurological:     General: No focal deficit present.     Mental Status: He is alert.     Sensory: No sensory deficit.     Motor: No weakness.     ED Results / Procedures / Treatments   Labs (all labs ordered are listed, but only abnormal results are displayed) Labs Reviewed - No data to display  EKG None  Radiology No results found.  Procedures Procedures (including critical care time)  Medications Ordered in ED Medications - No data to display  ED Course  I have reviewed the triage vital signs and the nursing notes.  Pertinent labs & imaging results that were available during my care of the patient were reviewed by me and considered in my medical decision making (see chart for details).    MDM Rules/Calculators/A&P                          Patient here with dental pain, multiple dental caries.  Requesting antibiotics.  States he is scheduled for an upcoming dental extraction.  He is also reporting nasal congestion with sinus pressure.  States this is a recurring problem for him and attributes it to chronic sinusitis and allergies.  No other concerning symptoms suggestive of Covid.  Patient states he is tested daily at his place of employment.  Vital signs reviewed.  No reported cough or shortness of breath.  No concerning symptoms for Ludewig's angina.  Airway is patent.  No trismus.  Low clinical suspicion for Covid or significant dental abscess at this time.  Will start patient on antibiotics.  Patient agrees to 3-day course of Afrin  for his nasal congestion.  He agrees to discontinue use after 3 days.  Close follow-up with his oral surgeon.  PAULMICHAEL SCHRECK was evaluated in Emergency Department on 01/22/2020 for the symptoms described in the history of present illness. He was evaluated in the context of the global COVID-19 pandemic, which necessitated consideration that the patient might be at risk for infection with the SARS-CoV-2 virus that causes COVID-19. Institutional protocols and algorithms that pertain to the evaluation of patients at risk for COVID-19 are in a state of  rapid change based on information released by regulatory bodies including the CDC and federal and state organizations. These policies and algorithms were followed during the patient's care in the ED.  Final Clinical Impression(s) / ED Diagnoses Final diagnoses:  Pain, dental  Chronic sinusitis, unspecified location    Rx / DC Orders ED Discharge Orders    None       Pauline Aus, PA-C 01/22/20 1326    Bethann Berkshire, MD 01/25/20 1028

## 2020-01-22 NOTE — Discharge Instructions (Addendum)
Take the antibiotic as directed until it is finished.  Be sure to keep your follow-up appointment with your dentist.  Warm salt water rinses several times a day.

## 2020-01-22 NOTE — ED Triage Notes (Signed)
Pt c/o left lower dental pain that started 3 days ago.
